# Patient Record
Sex: Male | Born: 1980 | Race: White | Hispanic: No | Marital: Married | Smoking: Former smoker
Health system: Southern US, Community
[De-identification: ages and names within clinical notes are randomized; demographics above are authoritative.]

## PROBLEM LIST (undated history)

## (undated) DIAGNOSIS — Z9889 Other specified postprocedural states: Secondary | ICD-10-CM

## (undated) DIAGNOSIS — L709 Acne, unspecified: Secondary | ICD-10-CM

## (undated) DIAGNOSIS — I10 Essential (primary) hypertension: Secondary | ICD-10-CM

## (undated) DIAGNOSIS — R112 Nausea with vomiting, unspecified: Secondary | ICD-10-CM

## (undated) DIAGNOSIS — R55 Syncope and collapse: Secondary | ICD-10-CM

## (undated) DIAGNOSIS — R002 Palpitations: Secondary | ICD-10-CM

## (undated) DIAGNOSIS — Q231 Congenital insufficiency of aortic valve: Secondary | ICD-10-CM

## (undated) DIAGNOSIS — R011 Cardiac murmur, unspecified: Secondary | ICD-10-CM

## (undated) DIAGNOSIS — N4400 Torsion of testis, unspecified: Secondary | ICD-10-CM

## (undated) DIAGNOSIS — Z87820 Personal history of traumatic brain injury: Secondary | ICD-10-CM

## (undated) HISTORY — DX: Palpitations: R00.2

## (undated) HISTORY — DX: Syncope and collapse: R55

## (undated) HISTORY — DX: Congenital insufficiency of aortic valve: Q23.1

## (undated) HISTORY — PX: ORCHIOPEXY: SHX479

---
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
## 1999-01-24 ENCOUNTER — Emergency Department (HOSPITAL_COMMUNITY): Admission: EM | Admit: 1999-01-24 | Discharge: 1999-01-25 | Payer: Self-pay | Admitting: Emergency Medicine

## 1999-05-26 ENCOUNTER — Emergency Department (HOSPITAL_COMMUNITY): Admission: EM | Admit: 1999-05-26 | Discharge: 1999-05-26 | Payer: Self-pay | Admitting: Emergency Medicine

## 1999-05-26 ENCOUNTER — Encounter: Payer: Self-pay | Admitting: Emergency Medicine

## 1999-06-02 ENCOUNTER — Encounter: Payer: Self-pay | Admitting: Orthopedic Surgery

## 1999-06-02 ENCOUNTER — Ambulatory Visit (HOSPITAL_COMMUNITY): Admission: RE | Admit: 1999-06-02 | Discharge: 1999-06-02 | Payer: Self-pay | Admitting: Orthopedic Surgery

## 1999-06-21 ENCOUNTER — Ambulatory Visit (HOSPITAL_BASED_OUTPATIENT_CLINIC_OR_DEPARTMENT_OTHER): Admission: RE | Admit: 1999-06-21 | Discharge: 1999-06-22 | Payer: Self-pay | Admitting: Orthopedic Surgery

## 1999-06-25 HISTORY — PX: ANTERIOR CRUCIATE LIGAMENT REPAIR: SHX115

## 2015-06-25 ENCOUNTER — Emergency Department (HOSPITAL_COMMUNITY): Payer: BLUE CROSS/BLUE SHIELD

## 2015-06-25 ENCOUNTER — Emergency Department (HOSPITAL_COMMUNITY)
Admission: EM | Admit: 2015-06-25 | Discharge: 2015-06-25 | Disposition: A | Discharge status: Home / Self Care | Payer: BLUE CROSS/BLUE SHIELD | Attending: Emergency Medicine | Admitting: Emergency Medicine

## 2015-06-25 ENCOUNTER — Encounter (HOSPITAL_COMMUNITY): Payer: Self-pay

## 2015-06-25 DIAGNOSIS — Y998 Other external cause status: Secondary | ICD-10-CM | POA: Diagnosis not present

## 2015-06-25 DIAGNOSIS — Z79899 Other long term (current) drug therapy: Secondary | ICD-10-CM | POA: Diagnosis not present

## 2015-06-25 DIAGNOSIS — W01198A Fall on same level from slipping, tripping and stumbling with subsequent striking against other object, initial encounter: Secondary | ICD-10-CM | POA: Insufficient documentation

## 2015-06-25 DIAGNOSIS — Z87891 Personal history of nicotine dependence: Secondary | ICD-10-CM | POA: Diagnosis not present

## 2015-06-25 DIAGNOSIS — Y9389 Activity, other specified: Secondary | ICD-10-CM | POA: Insufficient documentation

## 2015-06-25 DIAGNOSIS — S060X0A Concussion without loss of consciousness, initial encounter: Secondary | ICD-10-CM | POA: Diagnosis not present

## 2015-06-25 DIAGNOSIS — S0990XA Unspecified injury of head, initial encounter: Secondary | ICD-10-CM | POA: Diagnosis present

## 2015-06-25 DIAGNOSIS — F0781 Postconcussional syndrome: Secondary | ICD-10-CM

## 2015-06-25 DIAGNOSIS — Y9289 Other specified places as the place of occurrence of the external cause: Secondary | ICD-10-CM | POA: Diagnosis not present

## 2015-06-25 NOTE — ED Provider Notes (Signed)
CSN: [id number]     Arrival date & time 06/25/15  1605 History   First MD Initiated Contact with Patient 06/25/15 1810     Chief Complaint  Patient presents with  . Head Injury     (Consider location/radiation/quality/duration/timing/severity/associated sxs/prior Treatment) HPI  35 year old male that fell and hit his head approximately one week ago in the middle the night likely secondary to vasovagal. However ever since that time he's had intermittent headaches unlike headaches she's had before and also feelings of being "out of it". No history of concussions or other injuries. He has no other symptoms at this time. No chest pain, abdominal pain back pain or extremity pain. Exact complaining of any bruises or anything to his head. No change in vision. No exacerbating or relieving factors. No history of the same.    History reviewed. No pertinent past medical history. Past Surgical History  Procedure Laterality Date  . Anterior cruciate ligament repair     No family history on file. Social History  Substance Use Topics  . Smoking status: Former Games developermoker  . Smokeless tobacco: None  . Alcohol Use: Yes     Comment: occasional     Review of Systems  Constitutional: Negative for fever, chills and activity change.  HENT: Negative for congestion and rhinorrhea.   Eyes: Negative for visual disturbance.  Respiratory: Negative for cough and shortness of breath.   Cardiovascular: Negative for chest pain.  Gastrointestinal: Negative for vomiting, abdominal pain, diarrhea and constipation.  Endocrine: Negative for polyuria.  Genitourinary: Negative for dysuria, urgency, flank pain, decreased urine volume and penile pain.  Musculoskeletal: Negative for back pain and neck pain.  Skin: Negative for wound.  Neurological: Positive for dizziness and headaches. Negative for tremors and syncope.  All other systems reviewed and are negative.     Allergies  Review of patient's allergies  indicates no known allergies.  Home Medications   Prior to Admission medications   Medication Sig Start Date End Date Taking? Authorizing Provider  calcium carbonate (TUMS - DOSED IN MG ELEMENTAL CALCIUM) 500 MG chewable tablet Chew 2 tablets by mouth daily as needed for indigestion or heartburn.   Yes Historical Provider, MD  ibuprofen (ADVIL,MOTRIN) 200 MG tablet Take 400 mg by mouth every 6 (six) hours as needed for headache or moderate pain.   Yes Historical Provider, MD  Multiple Vitamin (MULTIVITAMIN WITH MINERALS) TABS tablet Take 1 tablet by mouth daily.   Yes Historical Provider, MD  tretinoin (RETIN-A) 0.1 % cream Apply 1 application topically daily as needed (acne).  05/10/15  Yes Historical Provider, MD   BP 134/88 mmHg  Pulse 63  Temp(Src) 97.5 F (36.4 C) (Oral)  Resp 14  SpO2 100% Physical Exam  Constitutional: He is oriented to person, place, and time. He appears well-developed and well-nourished.  HENT:  Head: Normocephalic and atraumatic.  Neck: Normal range of motion.  Cardiovascular: Normal rate.   Pulmonary/Chest: Effort normal. No respiratory distress.  Abdominal: He exhibits no distension.  Musculoskeletal: Normal range of motion. He exhibits no edema or tenderness.  Neurological: He is alert and oriented to person, place, and time.  No altered mental status, able to give full seemingly accurate history.  Face is symmetric, EOM's intact, pupils equal and reactive, vision intact, tongue and uvula midline without deviation Upper and Lower extremity motor 5/5, intact pain perception in distal extremities, 2+ reflexes in biceps, patella and achilles tendons. Finger to nose normal, heel to shin normal. Walks without assistance or  evident ataxia.    Nursing note and vitals reviewed.   ED Course  Procedures (including critical care time) Labs Review Labs Reviewed - No data to display  Imaging Review Ct Head Wo Contrast  06/25/2015  CLINICAL DATA:  Evaluate for  intraparenchymal bleed. Patient status post fall. Dizziness since the fall. No reported loss of consciousness. EXAM: CT HEAD WITHOUT CONTRAST TECHNIQUE: Contiguous axial images were obtained from the base of the skull through the vertex without intravenous contrast. COMPARISON:  None. FINDINGS: Ventricles and sulci are appropriate for patient's age. No evidence for acute cortically based infarct, intracranial hemorrhage, mass lesion mass-effect. Orbits are unremarkable. Paranasal sinuses are well aerated. Mastoid air cells are unremarkable. Calvarium is intact. IMPRESSION: No acute intracranial process. Electronically Signed   By: Annia Belt M.D.   On: 06/25/2015 17:01   I have personally reviewed and evaluated these images and lab results as part of my medical decision-making.   EKG Interpretation None      MDM   Final diagnoses:  Post concussive syndrome   35 year old male with a week of likely postconcussive syndrome. This happened after a fall. No evidence of head bleed on CT scan. Low suspicion for head bleed so don't think he needs a lumbar puncture at this time. No fevers or other symptoms suggest meningitis. No other symptoms suggesting intracranial abnormalities. Patient is stable for discharge with postconcussion discussion had at bedside. Follow with primary doctor as needed or neurology if prolonged symptoms.     Marily Memos, MD 06/25/15 2125

## 2015-06-25 NOTE — ED Notes (Signed)
Pt presents with c/o head injury. Pt reports that he fell on Christmas night and ever since then has been feeling dizzy and somewhat confused. Pt reports no vomiting but some nausea. Pt reports he "feels a little off". Reports that he consulted his family doctor today and was advised to have a CT scan done.

## 2015-06-25 NOTE — Discharge Instructions (Signed)
Post-Concussion Syndrome Post-concussion syndrome is the symptoms that can occur after a head injury. These symptoms can last from weeks to months. HOME CARE   Take medicines only as told by your doctor.  Do not take aspirin.  Sleep with your head raised to help with headaches.  Avoid activities that can cause another head injury.  Do not play contact sports like football, hockey, soccer, or basketball.  Do not do other risky activities like downhill skiing, martial arts, or horseback riding until your doctor says it is okay.  Keep all follow-up visits as told by your doctor. This is important.   GET HELP IF:   You have a harder time:  Paying attention.  Focusing.  Remembering.  Learning new information.  Dealing with stress.  You need more time to complete tasks.  You are easily bothered (irritable).  You have more symptoms.   Get help if you have any of these symptoms for more than two weeks after your injury:   Long-lasting (chronic) headaches.  Dizziness.  Trouble balancing.  Feeling sick to your stomach (nauseous).  Trouble with your vision.  Noise or light bothers you more.  Depression.  Mood swings.  Feeling worried (anxious).  Easily bothered.  Memory problems.  Trouble concentrating or paying attention.  Sleep problems.  Feeling tired all of the time.   GET HELP RIGHT AWAY IF:  You feel confused.  You feel very sleepy.  You are hard to wake up.  You feel sick to your stomach.  You keep throwing up (vomiting).  You feel like you are moving when you are not (vertigo).  Your eyes move back and forth very quickly.  You start shaking (convulsing) or pass out (faint).  You have very bad headaches that do not get better with medicine.  You cannot use your arms or legs like normal.  One of the black centers of your eyes (pupils) is bigger than the other.  You have clear or bloody fluid coming from your nose or ears.  Your  problems get worse, not better. MAKE SURE YOU:  Understand these instructions.  Will watch your condition.  Will get help right away if you are not doing well or get worse.   This information is not intended to replace advice given to you by your health care provider. Make sure you discuss any questions you have with your health care provider.   Document Released: 07/18/2004 Document Revised: 07/01/2014 Document Reviewed: 09/15/2013 Elsevier Interactive Patient Education 2016 [employer]  

## 2016-03-06 ENCOUNTER — Ambulatory Visit
Admission: RE | Admit: 2016-03-06 | Discharge: 2016-03-06 | Disposition: A | Discharge status: Home / Self Care | Payer: BLUE CROSS/BLUE SHIELD | Source: Ambulatory Visit | Attending: Registered Nurse | Admitting: Registered Nurse

## 2016-03-06 ENCOUNTER — Other Ambulatory Visit: Payer: Self-pay | Admitting: Registered Nurse

## 2016-03-06 DIAGNOSIS — M546 Pain in thoracic spine: Secondary | ICD-10-CM

## 2016-07-01 ENCOUNTER — Emergency Department (HOSPITAL_COMMUNITY): Payer: BLUE CROSS/BLUE SHIELD

## 2016-07-01 ENCOUNTER — Emergency Department (HOSPITAL_COMMUNITY)
Admission: EM | Admit: 2016-07-01 | Discharge: 2016-07-02 | Disposition: A | Discharge status: Home / Self Care | Payer: BLUE CROSS/BLUE SHIELD | Attending: Emergency Medicine | Admitting: Emergency Medicine

## 2016-07-01 ENCOUNTER — Encounter (HOSPITAL_COMMUNITY): Payer: Self-pay

## 2016-07-01 DIAGNOSIS — N50811 Right testicular pain: Secondary | ICD-10-CM

## 2016-07-01 DIAGNOSIS — Z87891 Personal history of nicotine dependence: Secondary | ICD-10-CM | POA: Insufficient documentation

## 2016-07-01 DIAGNOSIS — R52 Pain, unspecified: Secondary | ICD-10-CM

## 2016-07-01 HISTORY — DX: Torsion of testis, unspecified: N44.00

## 2016-07-01 LAB — URINALYSIS, ROUTINE W REFLEX MICROSCOPIC
Bilirubin Urine: NEGATIVE
GLUCOSE, UA: NEGATIVE mg/dL
Hgb urine dipstick: NEGATIVE
KETONES UR: NEGATIVE mg/dL
LEUKOCYTES UA: NEGATIVE
Nitrite: NEGATIVE
PROTEIN: NEGATIVE mg/dL
Specific Gravity, Urine: 1.015 (ref 1.005–1.030)
pH: 7 (ref 5.0–8.0)

## 2016-07-01 MED ORDER — CEFTRIAXONE SODIUM 250 MG IJ SOLR
250.0000 mg | Freq: Once | INTRAMUSCULAR | Status: AC
  Administered 2016-07-01: 250 mg via INTRAMUSCULAR
  Filled 2016-07-01: qty 250

## 2016-07-01 MED ORDER — OXYCODONE-ACETAMINOPHEN 5-325 MG PO TABS
ORAL_TABLET | ORAL | Status: AC
  Filled 2016-07-01: qty 1

## 2016-07-01 MED ORDER — LIDOCAINE HCL (PF) 1 % IJ SOLN
INTRAMUSCULAR | Status: AC
  Administered 2016-07-01: 1 mL
  Filled 2016-07-01: qty 5

## 2016-07-01 MED ORDER — OXYCODONE-ACETAMINOPHEN 5-325 MG PO TABS
1.0000 | ORAL_TABLET | ORAL | Status: DC | PRN
  Administered 2016-07-01: 1 via ORAL

## 2016-07-01 MED ORDER — AZITHROMYCIN 250 MG PO TABS
1000.0000 mg | ORAL_TABLET | Freq: Once | ORAL | Status: AC
  Administered 2016-07-01: 1000 mg via ORAL
  Filled 2016-07-01: qty 4

## 2016-07-01 NOTE — ED Notes (Signed)
Called US to retrieve patient ASAP

## 2016-07-01 NOTE — ED Provider Notes (Signed)
[facility]-EMERGENCY DEPT Provider Note   CSN: [id number] Arrival date & time: 07/01/16  1806  By signing my name below, I, Modena JanskyAlbert Thayil, attest that this documentation has been prepared under the direction and in the presence of non-physician practitioner, Felicie Mornavid Bricelyn Freestone, NP. Electronically Signed: Modena JanskyAlbert Thayil, Scribe. 07/01/2016. 11:12 PM.  History   Chief Complaint Chief Complaint  Patient presents with  . Testicle Pain   The history is provided by the patient. No language interpreter was used.  Testicle Pain  This is a new problem. The current episode started more than 2 days ago. The problem occurs constantly. The problem has been rapidly worsening. Associated symptoms include abdominal pain (Right-sided). Nothing aggravates the symptoms. Nothing relieves the symptoms. He has tried nothing for the symptoms.   HPI Comments: Douglas Marquez is a 36 y.o. male who presents to the Emergency Department complaining of constant moderate right testicular that started about a week ago. He states his pain worsened today. He describes the pain as a dull sensation with no modifying factors. He reports associated groin swelling and right-sided abdominal pain. He denies any nausea, vomiting (over the past week), or other complaints    PCP: Gaspar Garbeichard W Tisovec, MD  Past Medical History:  Diagnosis Date  . Testicular torsion     There are no active problems to display for this patient.   Past Surgical History:  Procedure Laterality Date  . ANTERIOR CRUCIATE LIGAMENT REPAIR         Home Medications    Prior to Admission medications   Medication Sig Start Date End Date Taking? Authorizing Provider  calcium carbonate (TUMS - DOSED IN MG ELEMENTAL CALCIUM) 500 MG chewable tablet Chew 2 tablets by mouth daily as needed for indigestion or heartburn.    Historical Provider, MD  ibuprofen (ADVIL,MOTRIN) 200 MG tablet Take 400 mg by mouth every 6 (six) hours as needed for headache or moderate pain.     Historical Provider, MD  Multiple Vitamin (MULTIVITAMIN WITH MINERALS) TABS tablet Take 1 tablet by mouth daily.    Historical Provider, MD  tretinoin (RETIN-A) 0.1 % cream Apply 1 application topically daily as needed (acne).  05/10/15   Historical Provider, MD    Family History No family history on file.  Social History Social History  Substance Use Topics  . Smoking status: Former Games developermoker  . Smokeless tobacco: Never Used  . Alcohol use Yes     Comment: occasional      Allergies   Patient has no known allergies.   Review of Systems Review of Systems  Gastrointestinal: Positive for abdominal pain (Right-sided). Negative for nausea and vomiting.  Genitourinary: Positive for scrotal swelling and testicular pain (Right-sided).  All other systems reviewed and are negative.    Physical Exam Updated Vital Signs BP 125/68   Pulse 68   Temp 98.7 F (37.1 C) (Oral)   Resp 16   Ht 6\' 1"  (1.854 m)   Wt 175 lb (79.4 kg)   SpO2 100%   BMI 23.09 kg/m   Physical Exam  Constitutional: He appears well-developed and well-nourished. No distress.  HENT:  Head: Normocephalic and atraumatic.  Eyes: Conjunctivae are normal.  Neck: Neck supple.  Cardiovascular: Normal rate.   Pulmonary/Chest: Effort normal.  Abdominal: Soft. Hernia confirmed negative in the right inguinal area and confirmed negative in the left inguinal area.  Genitourinary: Penis normal. Cremasteric reflex is present. Right testis shows tenderness. Right testis shows no mass and no swelling. Left testis shows no  mass, no swelling and no tenderness. Circumcised. No penile erythema. No discharge found.  Musculoskeletal: Normal range of motion.  Lymphadenopathy: No inguinal adenopathy noted on the right or left side.  Neurological: He is alert.  Skin: Skin is warm and dry.  Psychiatric: He has a normal mood and affect.  Nursing note and vitals reviewed.    ED Treatments / Results  DIAGNOSTIC STUDIES: Oxygen  Saturation is 100% on RA, normal by my interpretation.    COORDINATION OF CARE: 11:16 PM- Pt advised of plan for treatment and pt agrees.  Labs (all labs ordered are listed, but only abnormal results are displayed) Labs Reviewed  URINALYSIS, ROUTINE W REFLEX MICROSCOPIC    EKG  EKG Interpretation None       Radiology US Scrotum  Addendum Date: 07/01/2016   ADDENDUM REPORT: 07/01/2016 20:35 ADDENDUM: There is an error in the initial dictation of this report. Doppler ultrasound of the testes was performed, although not explicitly stated in the exam and findings portion of the initial report. Normal venous and arterial Doppler waveforms were seen in both testes. No sonographic evidence for torsion as stated in the initial impression. Electronically Signed   By: Rise Mu M.D.   On: 07/01/2016 20:35   Result Date: 07/01/2016 CLINICAL DATA:  Acute right testicular pain. Evaluate for testicular torsion. EXAM: ULTRASOUND OF SCROTUM TECHNIQUE: Complete ultrasound examination of the testicles, epididymis, and other scrotal structures was performed. COMPARISON:  None. FINDINGS: Right testicle Measurements: 4.7 x 2.5 x 2.7 cm. No mass or microlithiasis visualized. Left testicle Measurements: 5.3 x 2.6 x 2.8 cm. No mass or microlithiasis visualized. Right epididymis: Normal in size and appearance. Few small epididymal cyst noted. Left epididymis:  Normal in size and appearance. Hydrocele:  None visualized. Varicocele:  Left-sided varicocele. IMPRESSION: 1. No sonographic evidence for testicular torsion or other acute abnormality. 2. Left-sided varicocele. . Electronically Signed: By: Rise Mu M.D. On: 07/01/2016 19:35   [place] Art/ven Flow Abd Pelv Doppler  Addendum Date: 07/01/2016   ADDENDUM REPORT: 07/01/2016 20:35 ADDENDUM: There is an error in the initial dictation of this report. Doppler ultrasound of the testes was performed, although not explicitly stated in the exam and  findings portion of the initial report. Normal venous and arterial Doppler waveforms were seen in both testes. No sonographic evidence for torsion as stated in the initial impression. Electronically Signed   By: Rise Mu M.D.   On: 07/01/2016 20:35   Result Date: 07/01/2016 CLINICAL DATA:  Acute right testicular pain. Evaluate for testicular torsion. EXAM: ULTRASOUND OF SCROTUM TECHNIQUE: Complete ultrasound examination of the testicles, epididymis, and other scrotal structures was performed. COMPARISON:  None. FINDINGS: Right testicle Measurements: 4.7 x 2.5 x 2.7 cm. No mass or microlithiasis visualized. Left testicle Measurements: 5.3 x 2.6 x 2.8 cm. No mass or microlithiasis visualized. Right epididymis: Normal in size and appearance. Few small epididymal cyst noted. Left epididymis:  Normal in size and appearance. Hydrocele:  None visualized. Varicocele:  Left-sided varicocele. IMPRESSION: 1. No sonographic evidence for testicular torsion or other acute abnormality. 2. Left-sided varicocele. . Electronically Signed: By: Rise Mu M.D. On: 07/01/2016 19:35    Procedures Procedures (including critical care time)  Medications Ordered in ED Medications  oxyCODONE-acetaminophen (PERCOCET/ROXICET) 5-325 MG per tablet 1 tablet (1 tablet Oral Given 07/01/16 2152)     Initial Impression / Assessment and Plan / ED Course  I have reviewed the triage vital signs and the nursing notes.  Pertinent labs &  imaging results that were available during my care of the patient were reviewed by me and considered in my medical decision making (see chart for details).  Clinical Course   Patient with right testicular pain.  No torsion or blood flow abnormalities noted on testicular ultrasound. Normal UA. No penile discharge.  After discussion with attending, will treat for orchitis with rocephin and azithromycin. Urology follow-up. Home with anti-inflammatory. Return precautions  discussed.    Final Clinical Impressions(s) / ED Diagnoses   Final diagnoses:  Pain in right testicle    New Prescriptions New Prescriptions   NAPROXEN (NAPROSYN) 500 MG TABLET    Take 1 tablet (500 mg total) by mouth 2 (two) times daily.   I personally performed the services described in this documentation, which was scribed in my presence. The recorded information has been reviewed and is accurate.     Felicie Morn, NP 07/02/16 [id number]    Marily Memos, MD 07/02/16 [phone]

## 2016-07-01 NOTE — ED Triage Notes (Signed)
Per Pt, Pt is coming from home with complains of right groin pain. Pt had a testicular torsion in 97. About a week ago, pt had an episode with extreme pain and three syncopal episodes. Pt was assessed by EMS and pt did not get transport. Saw the urologist and they stated the there was decreased blood flow and he needed surgery. Pt requested to wait at the time. Pt reports today having and increase of pain in the right testicle with some contraction. Pt reports pain has worsened. Called urologist and was sent here for evaluation.

## 2016-07-02 MED ORDER — NAPROXEN 500 MG PO TABS: 500.0000 mg | ORAL_TABLET | Freq: Two times a day (BID) | ORAL | 0 refills | Status: DC

## 2016-07-02 NOTE — ED Notes (Signed)
Pt verbalized understanding of d/c instructions and has no further questions. Pt stable and NAD. Pt to follow up with urology.

## 2016-07-05 ENCOUNTER — Other Ambulatory Visit: Payer: Self-pay | Admitting: Urology

## 2016-07-05 NOTE — Progress Notes (Signed)
Please add SURGICAL ORDERS IN EPIC  Thanks

## 2016-07-11 ENCOUNTER — Encounter (HOSPITAL_BASED_OUTPATIENT_CLINIC_OR_DEPARTMENT_OTHER): Payer: Self-pay | Admitting: *Deleted

## 2016-07-11 NOTE — Progress Notes (Signed)
SPOKE W/ PT TODAY , [facility] AND SPOKE WITH CONI, OR SCHEDULER , CASE RESCHEDULED FOR 07-22-2016 AT 1415.  NPO AFTER MN W/ EXCEPTION CLEAR LIQUIDS UNTIL 0800 (NO CREAM/ MILK PRODUCTS).  ARRIVE AT 1245.  NEEDS HG.  MAY TAKE TYLENOL IF NEEDED AM DOS W/ SIPS OF WATER.

## 2016-07-22 ENCOUNTER — Ambulatory Visit (HOSPITAL_BASED_OUTPATIENT_CLINIC_OR_DEPARTMENT_OTHER): Admission: RE | Admit: 2016-07-22 | Payer: BLUE CROSS/BLUE SHIELD | Source: Ambulatory Visit | Admitting: Urology

## 2016-07-22 HISTORY — DX: Other specified postprocedural states: Z98.890

## 2016-07-22 HISTORY — DX: Nausea with vomiting, unspecified: R11.2

## 2016-07-22 HISTORY — DX: Acne, unspecified: L70.9

## 2016-07-22 HISTORY — DX: Personal history of traumatic brain injury: Z87.820

## 2016-07-22 HISTORY — DX: Cardiac murmur, unspecified: R01.1

## 2016-07-22 SURGERY — ORCHIOPEXY ADULT
Anesthesia: General | Laterality: Bilateral

## 2016-11-09 ENCOUNTER — Encounter (HOSPITAL_COMMUNITY): Payer: Self-pay

## 2016-11-09 ENCOUNTER — Emergency Department (HOSPITAL_COMMUNITY)
Admission: EM | Admit: 2016-11-09 | Discharge: 2016-11-09 | Disposition: A | Discharge status: Home / Self Care | Payer: BLUE CROSS/BLUE SHIELD | Attending: Emergency Medicine | Admitting: Emergency Medicine

## 2016-11-09 DIAGNOSIS — R55 Syncope and collapse: Secondary | ICD-10-CM | POA: Insufficient documentation

## 2016-11-09 DIAGNOSIS — Z87891 Personal history of nicotine dependence: Secondary | ICD-10-CM | POA: Diagnosis not present

## 2016-11-09 LAB — BASIC METABOLIC PANEL
ANION GAP: 11 (ref 5–15)
BUN: 12 mg/dL (ref 6–20)
CALCIUM: 9.6 mg/dL (ref 8.9–10.3)
CO2: 21 mmol/L — AB (ref 22–32)
Chloride: 103 mmol/L (ref 101–111)
Creatinine, Ser: 1.21 mg/dL (ref 0.61–1.24)
GFR calc Af Amer: >60 mL/min (ref >60)
GFR calc non Af Amer: >60 mL/min (ref >60)
Glucose, Bld: 107 mg/dL — ABNORMAL HIGH (ref 65–99)
POTASSIUM: 4 mmol/L (ref 3.5–5.1)
Sodium: 135 mmol/L (ref 135–145)

## 2016-11-09 LAB — CBC
HEMATOCRIT: 41.2 % (ref 39.0–52.0)
HEMOGLOBIN: 13.8 g/dL (ref 13.0–17.0)
MCH: 28.8 pg (ref 26.0–34.0)
MCHC: 33.5 g/dL (ref 30.0–36.0)
MCV: 86 fL (ref 78.0–100.0)
Platelets: 252 10*3/uL (ref 150–400)
RBC: 4.79 MIL/uL (ref 4.22–5.81)
RDW: 12.9 % (ref 11.5–15.5)
WBC: 6 10*3/uL (ref 4.0–10.5)

## 2016-11-09 LAB — URINALYSIS, ROUTINE W REFLEX MICROSCOPIC
BACTERIA UA: NONE SEEN
Bilirubin Urine: NEGATIVE
Glucose, UA: NEGATIVE mg/dL
Hgb urine dipstick: NEGATIVE
KETONES UR: NEGATIVE mg/dL
Leukocytes, UA: NEGATIVE
NITRITE: NEGATIVE
PROTEIN: 30 mg/dL — AB
Specific Gravity, Urine: 1.011 (ref 1.005–1.030)
pH: 7 (ref 5.0–8.0)

## 2016-11-09 MED ORDER — SODIUM CHLORIDE 0.9 % IV BOLUS (SEPSIS)
1000.0000 mL | Freq: Once | INTRAVENOUS | Status: AC
  Administered 2016-11-09: 1000 mL via INTRAVENOUS

## 2016-11-09 NOTE — ED Provider Notes (Signed)
[facility]-EMERGENCY DEPT Provider Note   CSN: [id number] Arrival date & time: 11/09/16  1052     History   Chief Complaint Chief Complaint  Patient presents with  . Loss of Consciousness    pt while working out had near syncopal episode than hen EMS arrived he had another syncopal episode     HPI Douglas Marquez is a 36 y.o. male.  Patient with history of syncopal episodes presents with syncopal episode this morning while working out with a Psychologist, educational. They were lifting weights. Patient states that he drank a lot of coffee this morning and has not been hydrating well. Approximately 20 minutes into the workout he started feeling lightheaded and very tired. He was allowed to lay flat and he reports having 3 syncopal episodes with preceding prodrome. He felt nauseous and was sweaty. No chest pain or shortness of breath. Episodes were brief with no seizure activity or postictal phase. Patient was told that his eyes were deviated to the left during one episode. EMS was called for transport. Patient has had previous episodes, once after drinking alcohol. They have not all been related to exertion. No recent fever or other symptoms of illness. Patient denies over-the-counter cold medications. No family history of arrhythmia or sudden cardiac death at a young age. No previous echocardiograms or workup for this. Patient does report having chronic intermittent pains down his leg and history of groin pain. This is not worse than usual today. The onset of this condition was acute. The course is resolved. Aggravating factors: none. Alleviating factors: none.        Past Medical History:  Diagnosis Date  . Acne   . Heart murmur   . History of concussion    12/ 2016  no residual   . PONV (postoperative nausea and vomiting)   . Testicular torsion    intermittent bilaterally    There are no active problems to display for this patient.   Past Surgical History:  Procedure Laterality Date  . ANTERIOR  CRUCIATE LIGAMENT REPAIR Left 06/1999  . ORCHIOPEXY Bilateral 1990's       Home Medications    Prior to Admission medications   Medication Sig Start Date End Date Taking? Authorizing Provider  calcium carbonate (TUMS - DOSED IN MG ELEMENTAL CALCIUM) 500 MG chewable tablet Chew 2 tablets by mouth daily as needed for indigestion or heartburn.    [provider]  ibuprofen (ADVIL,MOTRIN) 200 MG tablet Take 400 mg by mouth every 6 (six) hours as needed for headache or moderate pain.    [provider]  Multiple Vitamin (MULTIVITAMIN WITH MINERALS) TABS tablet Take 1 tablet by mouth daily.    [provider]  naproxen (NAPROSYN) 500 MG tablet Take 1 tablet (500 mg total) by mouth 2 (two) times daily. Patient taking differently: Take 500 mg by mouth 2 (two) times daily as needed.  07/02/16   Felicie Morn, NP  tretinoin (RETIN-A) 0.1 % cream Apply 1 application topically daily as needed (acne).  05/10/15   [provider]    Family History No family history on file.  Social History Social History  Substance Use Topics  . Smoking status: Former Smoker    Years: 2.00    Types: Cigarettes    Quit date: 07/11/2008  . Smokeless tobacco: Never Used  . Alcohol use Yes     Comment: RARE     Allergies   Patient has no known allergies.   Review of Systems Review of Systems  Constitutional: Positive for fatigue. Negative for fever.  HENT: Negative for rhinorrhea and sore throat.   Eyes: Negative for redness.  Respiratory: Negative for cough.   Cardiovascular: Negative for chest pain.  Gastrointestinal: Negative for abdominal pain, diarrhea, nausea and vomiting.  Genitourinary: Negative for dysuria.  Musculoskeletal: Negative for myalgias.  Skin: Negative for rash.  Neurological: Positive for syncope. Negative for headaches.     Physical Exam Updated Vital Signs BP 99/62 (BP Location: Right Arm)   Pulse 78   Temp 97.8 F (36.6 C) (Oral)   Resp  19   Ht 6\' 1"  (1.854 m)   Wt 183 lb (83 kg)   SpO2 100%   BMI 24.14 kg/m   Physical Exam  Constitutional: He appears well-developed and well-nourished.  HENT:  Head: Normocephalic and atraumatic.  Mouth/Throat: Oropharynx is clear and moist and mucous membranes are normal. Mucous membranes are not dry.  Eyes: Conjunctivae are normal. Right eye exhibits no discharge. Left eye exhibits no discharge.  Neck: Trachea normal and normal range of motion. Neck supple. Normal carotid pulses and no JVD present. No muscular tenderness present. Carotid bruit is not present. No tracheal deviation present.  Cardiovascular: Normal rate, regular rhythm, S1 normal, S2 normal, normal heart sounds and intact distal pulses.  Exam reveals no distant heart sounds and no decreased pulses.   No murmur heard. Pulmonary/Chest: Effort normal and breath sounds normal. No respiratory distress. He has no wheezes. He exhibits no tenderness.  Abdominal: Soft. Normal aorta and bowel sounds are normal. There is no tenderness. There is no rebound and no guarding.  Musculoskeletal: He exhibits no edema.  Neurological: He is alert.  Skin: Skin is warm and dry. He is not diaphoretic. No cyanosis. No pallor.  Psychiatric: He has a normal mood and affect.  Nursing note and vitals reviewed.    ED Treatments / Results  Labs (all labs ordered are listed, but only abnormal results are displayed) Labs Reviewed  BASIC METABOLIC PANEL - Abnormal; Notable for the following:       Result Value   CO2 21 (*)    Glucose, Bld 107 (*)    All other components within normal limits  CBC  URINALYSIS, ROUTINE W REFLEX MICROSCOPIC    EKG  EKG Interpretation  Date/Time:  Saturday Nov 09 2016 11:02:48 EDT Ventricular Rate:  72 PR Interval:    QRS Duration: 117 QT Interval:  407 QTC Calculation: 446 R Axis:   14 Text Interpretation:  Sinus rhythm Nonspecific intraventricular conduction delay ST elev, probable normal early repol  pattern No previous ECGs available Confirmed by Mancel BaleWentz, Elliott [phone]([id number]) on 11/09/2016 11:08:30 AM       Radiology No results found.  Procedures Procedures (including critical care time)  Medications Ordered in ED Medications  sodium chloride 0.9 % bolus 1,000 mL (0 mLs Intravenous Stopped 11/09/16 1324)      Initial Impression / Assessment and Plan / ED Course  I have reviewed the triage vital signs and the nursing notes.  Pertinent labs & imaging results that were available during my care of the patient were reviewed by me and considered in my medical decision making (see chart for details).     Patient seen and examined. Work-up initiated. Medications ordered.   Vital signs reviewed and are as follows: BP 99/62 (BP Location: Right Arm)   Pulse 78   Temp 97.8 F (36.6 C) (Oral)   Resp 19   Ht 6\' 1"  (1.854 m)   Wt  183 lb (83 kg)   SpO2 100%   BMI 24.14 kg/m   EKG reviewed.   1:48 PM patient continuing to do well. I discussed findings with him and family at bedside.  Will give cardiology referral patient encouraged not to do any strenuous exercise until he follows up and is clear.  Encouraged return to the emergency department with additional syncopal episodes, especially if they have any features which are different than previous, chest pain or shortness of breath.  Patient verbalizes understanding and agrees with plan.    Final Clinical Impressions(s) / ED Diagnoses   Final diagnoses:  Syncope, unspecified syncope type   Patient had several grouped episodes of syncope today while working out. He had a positive prodrome and has had this with episodes in the past. He admits to drinking a lot of coffee this morning and not being well hydrated. He felt fatigued prior to the workout. He had no chest pain. No seizure-like activity. No neurological deficits during ED visit. Cardiovascular exam is normal. EKG does not show any prolonged QTC, signs of reentrant  tachycardia, Brugada syndrome, or other concerning abnormality here. Given that patient had exertional symptoms today, feel that cardiology evaluation prior to resuming strenuous exercise is recommended. Patient given referrals as above.  New Prescriptions Discharge Medication List as of 11/09/2016  1:43 PM       Renne Crigler, PA-C 11/09/16 1350    Mancel Bale, MD 11/09/16 [phone]

## 2016-11-09 NOTE — ED Triage Notes (Signed)
Pt arrives awake and alert wife at bedside states that this has occurred several times and that pt. Has not been worked up by neurology for these episodes

## 2016-11-09 NOTE — Discharge Instructions (Signed)
Please read and follow all provided instructions.  Your diagnoses today include:  1. Syncope, unspecified syncope type    Tests performed today include:  An EKG of your heart  Blood counts and electrolytes  Urine test  Vital signs. See below for your results today.   Medications prescribed:   None  Take any prescribed medications only as directed.  Follow-up instructions: Please follow-up with the cardiologist for further evaluation as we discussed.   Return instructions:  SEEK IMMEDIATE MEDICAL ATTENTION IF:  You have severe chest pain, especially if the pain is crushing or pressure-like and spreads to the arms, back, neck, or jaw, or if you have sweating, nausea (feeling sick to your stomach), or shortness of breath. THIS IS AN EMERGENCY. Don't wait to see if the pain will go away. Get medical help at once. Call 911 or 0 (operator). DO NOT drive yourself to the hospital.   Your chest pain gets worse and does not go away with rest.   You have an attack of chest pain lasting longer than usual, despite rest and treatment with the medications your caregiver has prescribed.   You wake from sleep with chest pain or shortness of breath.  You feel dizzy or faint.  You have chest pain not typical of your usual pain for which you originally saw your caregiver.   You have any other emergent concerns regarding your health.  Your vital signs today were: BP 104/65 (BP Location: Right Arm)    Pulse 61    Temp 97.8 F (36.6 C) (Oral)    Resp 17    Ht 6\' 1"  (1.854 m)    Wt 183 lb (83 kg)    SpO2 100%    BMI 24.14 kg/m  If your blood pressure (BP) was elevated above 135/85 this visit, please have this repeated by your doctor within one month. --------------

## 2016-11-14 ENCOUNTER — Encounter: Payer: Self-pay | Admitting: Cardiovascular Disease

## 2016-11-14 ENCOUNTER — Ambulatory Visit (INDEPENDENT_AMBULATORY_CARE_PROVIDER_SITE_OTHER): Payer: BLUE CROSS/BLUE SHIELD | Admitting: Cardiovascular Disease

## 2016-11-14 VITALS — BP 124/86 | HR 66 | Ht 73.0 in | Wt 187.8 lb

## 2016-11-14 DIAGNOSIS — R079 Chest pain, unspecified: Secondary | ICD-10-CM | POA: Diagnosis not present

## 2016-11-14 DIAGNOSIS — R011 Cardiac murmur, unspecified: Secondary | ICD-10-CM

## 2016-11-14 DIAGNOSIS — Z13220 Encounter for screening for lipoid disorders: Secondary | ICD-10-CM

## 2016-11-14 DIAGNOSIS — R002 Palpitations: Secondary | ICD-10-CM

## 2016-11-14 DIAGNOSIS — R55 Syncope and collapse: Secondary | ICD-10-CM | POA: Diagnosis not present

## 2016-11-14 HISTORY — DX: Syncope and collapse: R55

## 2016-11-14 HISTORY — DX: Palpitations: R00.2

## 2016-11-14 LAB — T4, FREE: Free T4: 1.71 ng/dL (ref 0.82–1.77)

## 2016-11-14 LAB — LIPID PANEL
CHOL/HDL RATIO: 3.7 ratio (ref 0.0–5.0)
Cholesterol, Total: 184 mg/dL (ref 100–199)
HDL: 50 mg/dL (ref >39)
LDL CALC: 117 mg/dL — AB (ref 0–99)
TRIGLYCERIDES: 85 mg/dL (ref 0–149)
VLDL Cholesterol Cal: 17 mg/dL (ref 5–40)

## 2016-11-14 LAB — TSH: TSH: 1.43 u[IU]/mL (ref 0.450–4.500)

## 2016-11-14 NOTE — Progress Notes (Signed)
Cardiology Office Note   Date:  11/14/2016   ID:  Douglas CanalRobert Marquez, DOB 11-04-1980, MRN [id number]  PCP:  Gaspar Garbeisovec, Richard W, MD  Cardiologist:   Chilton Siiffany Melvin, MD   Chief Complaint  Patient presents with  . New Patient (Initial Visit)    chest pain     History of Present Illness: Douglas Marquez is a 36 y.o. male who presents for an evaluation of syncope.  Douglas Marquez was seen in the ED 11/09/16 with an episode of syncope that occurred after working out.  That morning he awakened early and drinks 6 cups of coffee. On average he drinks 4 or 5 cups of coffee daily. Several hours later he went to work with the trainer. 27 minutes into the workout he started getting nauseous and very hot. He recalls feeling poorly and lay down with his legs elevated. The next thing he remembers people were around him trying to wake him up. He initially thought that he was well but had another syncopal episode prior to the arrival of EMS. Once the EMS workers arrived his blood pressure was reportedly in the 90s systolic. He had another syncopal episode while talking with them. Each time he was out for between 10 and 30 seconds. There is no preceding chest pain, palpitations, or shortness of breath. He was seen in the emergency departmen  where EKG revealed sinus rhythm with repolarization abnormalities but was otherwise unremarkable. He was referred to cardiology for outpatient evaluation.  Since that episode Douglas Marquez reports feeling well for the first 2 days. However in the last 2 days he has been feeling anxious and tired. He notes episodes of sharp, substernal chest pain that feels almost like indigestion. He thinks it is been more diaphoretic then usual. These episodes of chest pain last for approximately 10 seconds and never with exertion. He has noted that when laying down or when sitting. He hasn't done much strenuous activity but has cut his grass and other mild activities and hasn't experienced any chest pain or  shortness of breath. He is also somewhat more short of breath than usual when climbing stairs. He notes that he is very anxious after this episode, especially since his wife is currently out of town. He's had several episodes of syncope in the past. It happened some in college. Prior triggers have included having intense abdominal pain and standing quickly. He also had an episode of syncope in the setting of testicular torsion.  He notes occasional palpitations. This typically happens at rest and lasts for less than 1 minute. It is associated with mild lightheadedness. This occurred a few times over the last several months.  Douglas Marquez has a family history of premature CAD. Both maternal grandparents had heart attacks in their 5040s.   Past Medical History:  Diagnosis Date  . Acne   . Heart murmur   . History of concussion    12/ 2016  no residual   . Palpitations 11/14/2016  . PONV (postoperative nausea and vomiting)   . Syncope 11/14/2016  . Testicular torsion    intermittent bilaterally    Past Surgical History:  Procedure Laterality Date  . ANTERIOR CRUCIATE LIGAMENT REPAIR Left 06/1999  . ORCHIOPEXY Bilateral 1990's     Current Outpatient Prescriptions  Medication Sig Dispense Refill  . calcium carbonate (TUMS - DOSED IN MG ELEMENTAL CALCIUM) 500 MG chewable tablet Chew 2 tablets by mouth daily as needed for indigestion or heartburn.    Marland Kitchen. ibuprofen (ADVIL,MOTRIN)  200 MG tablet Take 400 mg by mouth every 6 (six) hours as needed for headache or moderate pain.    . Multiple Vitamin (MULTIVITAMIN WITH MINERALS) TABS tablet Take 1 tablet by mouth daily.    Marland Kitchen tretinoin (RETIN-A) 0.1 % cream Apply 1 application topically daily as needed (acne).   0   No current facility-administered medications for this visit.     Allergies:   Patient has no known allergies.    Social History:  The patient  reports that he quit smoking about 8 years ago. His smoking use included Cigarettes. He quit  after 2.00 years of use. He has never used smokeless tobacco. He reports that he drinks alcohol. He reports that he does not use drugs.   Family History:  The patient's family history includes Cancer in his maternal grandmother and paternal grandfather; Heart attack in his maternal grandfather and maternal grandmother; Heart disease in his paternal grandfather; Parkinson's disease in his maternal grandfather.    ROS:  Please see the history of present illness.   Otherwise, review of systems are positive for none.   All other systems are reviewed and negative.    PHYSICAL EXAM: VS:  BP 124/86 (BP Location: Left Arm)   Pulse 66   Ht 6\' 1"  (1.854 m)   Wt 85.2 kg (187 lb 12.8 oz)   SpO2 100%   BMI 24.78 kg/m  , BMI Body mass index is 24.78 kg/m. GENERAL:  Well appearing HEENT:  Pupils equal round and reactive, fundi not visualized, oral mucosa unremarkable NECK:  No jugular venous distention, waveform within normal limits, carotid upstroke brisk and symmetric, no bruits, no thyromegaly LYMPHATICS:  No cervical adenopathy LUNGS:  Clear to auscultation bilaterally HEART:  RRR.  PMI not displaced or sustained,S1 and S2 within normal limits, no S3, no S4, no clicks, no rubs, I/VI systolic murmur at LLSB ABD:  Flat, positive bowel sounds normal in frequency in pitch, no bruits, no rebound, no guarding, no midline pulsatile mass, no hepatomegaly, no splenomegaly EXT:  2 plus pulses throughout, no edema, no cyanosis no clubbing SKIN:  No rashes no nodules NEURO:  Cranial nerves II through XII grossly intact, motor grossly intact throughout PSYCH:  Cognitively intact, oriented to person place and time    EKG:  EKG is not ordered today. The ekg ordered 11/09/13 demonstrates sinus rhythm. Rate 72 bpm.    Recent Labs: 11/09/2016: BUN 12; Creatinine, Ser 1.21; Hemoglobin 13.8; Platelets 252; Potassium 4.0; Sodium 135    Lipid Panel No results found for: CHOL, TRIG, HDL, CHOLHDL, VLDL, LDLCALC,  LDLDIRECT    Wt Readings from Last 3 Encounters:  11/14/16 85.2 kg (187 lb 12.8 oz)  11/09/16 83 kg (183 lb)  07/01/16 79.4 kg (175 lb)      ASSESSMENT AND PLAN:  # Syncope:  This episode was likely triggered by intravascular volume depletion and heavy caffeine intake.  EKG is unremarkable and without evidence of arrhythmias, Brugada and long QT.  He has a history of recurrent syncope that has been related to an orthostatic or vasovagal episode.  Given that this was associated with exercise and he has a family history of MI in the 5s, we will get an ETT to evaluate for both ischemia and arrhythmias.  We will also get an echo to assess for structural abnormality.  He has a faint systolic murmur.  R/O HCM.  # Palpitations:  Episodes are infrequent.  Check TSH/free T4.  Continue to monitor and defer  ambulatory monitor for now.   # CV Disease Prevention: Check fasting lipids.  Current medicines are reviewed at length with the patient today.  The patient does not have concerns regarding medicines.  The following changes have been made:  no change  Labs/ tests ordered today include:   Orders Placed This Encounter  Procedures  . Lipid panel  . TSH  . T4, free  . EXERCISE TOLERANCE TEST  . ECHOCARDIOGRAM COMPLETE     Disposition:   FU with Jep Dyas C. Duke Salvia, MD, [facility] in 1 month.    This note was written with the assistance of speech recognition software.  Please excuse any transcriptional errors.  Signed, Lindy Pennisi C. Duke Salvia, MD, [facility]  11/14/2016 1:17 PM    [facility]

## 2016-11-14 NOTE — Patient Instructions (Signed)
Your physician recommends that you return for lab work TODAY  Your physician has requested that you have an echocardiogram @ [address]. Echocardiography is a painless test that uses sound waves to create images of your heart. It provides your doctor with information about the size and shape of your heart and how well your heart's chambers and valves are working. This procedure takes approximately one hour. There are no restrictions for this procedure.  Your physician has requested that you have an exercise tolerance test. For further information please visit [email or web address]. Please also follow instruction sheet, as given.  Your physician recommends that you schedule a follow-up appointment in: ONE MONTH with Dr. Duke Salviaandolph (after your testing)

## 2016-11-22 ENCOUNTER — Telehealth: Payer: Self-pay | Admitting: Cardiovascular Disease

## 2016-11-22 NOTE — Telephone Encounter (Signed)
Spoke with pt wife, labs and dr Leonides Sakerandolph's recommendations given.

## 2016-11-22 NOTE — Telephone Encounter (Signed)
New message   Pt wife is returning your call for lab results

## 2016-11-28 ENCOUNTER — Telehealth (HOSPITAL_COMMUNITY): Payer: Self-pay

## 2016-11-28 ENCOUNTER — Ambulatory Visit (HOSPITAL_COMMUNITY): Payer: BLUE CROSS/BLUE SHIELD | Attending: Cardiology

## 2016-11-28 ENCOUNTER — Other Ambulatory Visit: Payer: Self-pay

## 2016-11-28 DIAGNOSIS — I082 Rheumatic disorders of both aortic and tricuspid valves: Secondary | ICD-10-CM | POA: Diagnosis not present

## 2016-11-28 DIAGNOSIS — R002 Palpitations: Secondary | ICD-10-CM | POA: Diagnosis not present

## 2016-11-28 DIAGNOSIS — R011 Cardiac murmur, unspecified: Secondary | ICD-10-CM | POA: Diagnosis not present

## 2016-11-28 DIAGNOSIS — Q231 Congenital insufficiency of aortic valve: Secondary | ICD-10-CM | POA: Diagnosis not present

## 2016-11-28 DIAGNOSIS — R55 Syncope and collapse: Secondary | ICD-10-CM | POA: Diagnosis not present

## 2016-11-28 NOTE — Telephone Encounter (Signed)
Encounter complete. 

## 2016-12-02 ENCOUNTER — Telehealth: Payer: Self-pay | Admitting: Cardiovascular Disease

## 2016-12-02 NOTE — Telephone Encounter (Signed)
Pt would like his Echo results from 11-28-16 please.

## 2016-12-02 NOTE — Telephone Encounter (Signed)
Patient's call returned and results given. He verbalized his understanding. Appointment for stress test tomorrow was confirmed.   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 It is functioning well. This is not the cause of his syncope. Will discuss more at follow up.

## 2016-12-03 ENCOUNTER — Ambulatory Visit (HOSPITAL_COMMUNITY)
Admission: RE | Admit: 2016-12-03 | Discharge: 2016-12-03 | Disposition: A | Discharge status: Home / Self Care | Payer: BLUE CROSS/BLUE SHIELD | Source: Ambulatory Visit | Attending: Cardiovascular Disease | Admitting: Cardiovascular Disease

## 2016-12-03 DIAGNOSIS — R079 Chest pain, unspecified: Secondary | ICD-10-CM | POA: Diagnosis not present

## 2016-12-03 LAB — EXERCISE TOLERANCE TEST
CHL CUP MPHR: 182 {beats}/min
CHL CUP RESTING HR STRESS: 61 {beats}/min
CSEPEW: 13.8 METS
CSEPHR: 99 %
CSEPPHR: 181 {beats}/min
Exercise duration (min): 12 min
Exercise duration (sec): 13 s
RPE: 18

## 2016-12-24 ENCOUNTER — Telehealth: Payer: Self-pay | Admitting: Cardiovascular Disease

## 2016-12-24 NOTE — Telephone Encounter (Signed)
Closed Encounter  °

## 2017-01-02 ENCOUNTER — Ambulatory Visit: Payer: BLUE CROSS/BLUE SHIELD | Admitting: Cardiovascular Disease

## 2017-01-23 ENCOUNTER — Ambulatory Visit: Payer: BLUE CROSS/BLUE SHIELD | Admitting: Cardiovascular Disease

## 2017-02-28 ENCOUNTER — Encounter: Payer: Self-pay | Admitting: Cardiovascular Disease

## 2017-02-28 ENCOUNTER — Encounter (INDEPENDENT_AMBULATORY_CARE_PROVIDER_SITE_OTHER): Payer: Self-pay

## 2017-02-28 ENCOUNTER — Ambulatory Visit (INDEPENDENT_AMBULATORY_CARE_PROVIDER_SITE_OTHER): Payer: BLUE CROSS/BLUE SHIELD | Admitting: Cardiovascular Disease

## 2017-02-28 VITALS — BP 120/74 | HR 66 | Ht 73.0 in | Wt 198.8 lb

## 2017-02-28 DIAGNOSIS — Q2381 Bicuspid aortic valve: Secondary | ICD-10-CM

## 2017-02-28 DIAGNOSIS — R011 Cardiac murmur, unspecified: Secondary | ICD-10-CM

## 2017-02-28 DIAGNOSIS — Q231 Congenital insufficiency of aortic valve: Secondary | ICD-10-CM | POA: Diagnosis not present

## 2017-02-28 HISTORY — DX: Congenital insufficiency of aortic valve: Q23.1

## 2017-02-28 HISTORY — DX: Bicuspid aortic valve: Q23.81

## 2017-02-28 NOTE — Progress Notes (Signed)
Cardiology Office Note   Date:  02/28/2017   ID:  Douglas Marquez, DOB 06/06/1981, MRN [id number]  PCP:  Gaspar Garbe, MD  Cardiologist:   Chilton Si, MD   Chief Complaint  Patient presents with  . Follow-up    Pt states no Sx.      History of Present Illness: Douglas Marquez is a 36 y.o. male with bicuspid aortic valve and recurrent syncope who presents for follow-up. He was initially seen 10/2016 foran evaluation of syncope.  Mr. Koller was seen in the ED 11/09/16 with an episode of syncope that occurred after working out.  That morning he awakened early and drinks 6 cups of coffee. On average he drinks 4 or 5 cups of coffee daily.  Once the EMS workers arrived his blood pressure was reportedly in the 90s systolic. He had another syncopal episode while talking with them. At his appointment he reported atypical chest pain and was referred for an ETT that was negative for ischemia. He achieved 13.8 METS on a Bruce protocol. He was noted to have a systolic murmur and was referred for an echocardiogram 11/28/16 that revealed LVEF 50-55% and a bicuspid aortic valve with mild aortic regurgitation.  Mr. Bienvenue has a family history of premature CAD. Both maternal grandparents had heart attacks in their 35s.  Since his last appointment Mr. Mogel has been doing well.  He denies any chest pain or shortness of breath.  He hasn't been exercising much because of pain and numbness in his R leg.  He has follow up with orthopedics and neurology.  He is limiting his caffeine and denies palpitations, lightheadedness, dizziness or syncope.  He denies lower extremity edema, orthopnea or PND.   Past Medical History:  Diagnosis Date  . Acne   . Bicuspid aortic valve 02/28/2017  . Heart murmur   . History of concussion    12/ 2016  no residual   . Palpitations 11/14/2016  . PONV (postoperative nausea and vomiting)   . Syncope 11/14/2016  . Testicular torsion    intermittent bilaterally    Past Surgical  History:  Procedure Laterality Date  . ANTERIOR CRUCIATE LIGAMENT REPAIR Left 06/1999  . ORCHIOPEXY Bilateral 1990's     Current Outpatient Prescriptions  Medication Sig Dispense Refill  . calcium carbonate (TUMS - DOSED IN MG ELEMENTAL CALCIUM) 500 MG chewable tablet Chew 2 tablets by mouth daily as needed for indigestion or heartburn.    Marland Kitchen ibuprofen (ADVIL,MOTRIN) 200 MG tablet Take 400 mg by mouth every 6 (six) hours as needed for headache or moderate pain.    . Multiple Vitamin (MULTIVITAMIN WITH MINERALS) TABS tablet Take 1 tablet by mouth daily.    Marland Kitchen tretinoin (RETIN-A) 0.1 % cream Apply 1 application topically daily as needed (acne).   0   No current facility-administered medications for this visit.     Allergies:   Patient has no known allergies.    Social History:  The patient  reports that he quit smoking about 8 years ago. His smoking use included Cigarettes. He quit after 2.00 years of use. He has never used smokeless tobacco. He reports that he drinks alcohol. He reports that he does not use drugs.   Family History:  The patient's family history includes Cancer in his maternal grandmother and paternal grandfather; Heart attack in his maternal grandfather and maternal grandmother; Heart disease in his paternal grandfather; Parkinson's disease in his maternal grandfather.    ROS:  Please see the  history of present illness.   Otherwise, review of systems are positive for none.   All other systems are reviewed and negative.    PHYSICAL EXAM: VS:  BP 120/74   Pulse 66   Ht 6\' 1"  (1.854 m)   Wt 90.2 kg (198 lb 12.8 oz)   BMI 26.23 kg/m  , BMI Body mass index is 26.23 kg/m. GENERAL:  Well appearing HEENT: Pupils equal round and reactive, fundi not visualized, oral mucosa unremarkable NECK:  No jugular venous distention, waveform within normal limits, carotid upstroke brisk and symmetric, no bruits LUNGS:  Clear to auscultation bilaterally HEART:  RRR.  PMI not displaced  or sustained,S1 and S2 within normal limits, no S3, no S4, no clicks, no rubs, no murmurs ABD:  Flat, positive bowel sounds normal in frequency in pitch, no bruits, no rebound, no guarding, no midline pulsatile mass, no hepatomegaly, no splenomegaly EXT:  2 plus pulses throughout, no edema, no cyanosis no clubbing SKIN:  No rashes no nodules NEURO:  Cranial nerves II through XII grossly intact, motor grossly intact throughout PSYCH:  Cognitively intact, oriented to person place and time   EKG:  EKG is not ordered today. The ekg ordered 11/09/13 demonstrates sinus rhythm. Rate 72 bpm.   ETT 12/03/16:  Negative for ischemia.  13.8 METS on a Bruce protocol.  Echo 11/28/16:  - Left ventricle: The cavity size was normal. There was moderate   focal basal and mild concentric hypertrophy. Systolic function   was normal. The estimated ejection fraction was in the range of   50% to 55%. Wall motion was normal; there were no regional wall   motion abnormalities. Left ventricular diastolic function   parameters were normal. - Aortic valve: Bicuspid; mildly thickened, mildly calcified   leaflets. There was mild regurgitation.  Recent Labs: 11/09/2016: BUN 12; Creatinine, Ser 1.21; Hemoglobin 13.8; Platelets 252; Potassium 4.0; Sodium 135 11/14/2016: TSH 1.430    Lipid Panel    Component Value Date/Time   CHOL 184 11/14/2016 1150   TRIG 85 11/14/2016 1150   HDL 50 11/14/2016 1150   CHOLHDL 3.7 11/14/2016 1150   LDLCALC 117 (H) 11/14/2016 1150      Wt Readings from Last 3 Encounters:  02/28/17 90.2 kg (198 lb 12.8 oz)  11/14/16 85.2 kg (187 lb 12.8 oz)  11/09/16 83 kg (183 lb)      ASSESSMENT AND PLAN:  # Bicuspid aortic valve: No aortic stenosis and no symptoms.  Repeat echo in 1 year.   # Syncope:  This episode was likely triggered by intravascular volume depletion and heavy caffeine intake.  No recurrent episodes.   # Palpitations: Resolved after limiting caffeine.   # CV  Disease Prevention: LDL 117 10/2016.   Current medicines are reviewed at length with the patient today.  The patient does not have concerns regarding medicines.  The following changes have been made:  no change  Labs/ tests ordered today include:   Orders Placed This Encounter  Procedures  . ECHOCARDIOGRAM COMPLETE   Time spent: 20 minutes-Greater than 50% of this time was spent in counseling, explanation of diagnosis, planning of further management, and coordination of care.   Disposition:   FU with Keyen Marban C. Duke Salviaandolph, MD, [facility] in 1 year.   This note was written with the assistance of speech recognition software.  Please excuse any transcriptional errors.  Signed, Mikyle Sox C. Duke Salviaandolph, MD, [facility]  02/28/2017 8:17 AM    [facility]

## 2017-02-28 NOTE — Patient Instructions (Signed)
Medication Instructions:  Your physician recommends that you continue on your current medications as directed. Please refer to the Current Medication list given to you today.  Labwork: none  Testing/Procedures: Your physician has requested that you have an echocardiogram. Echocardiography is a painless test that uses sound waves to create images of your heart. It provides your doctor with information about the size and shape of your heart and how well your heart's chambers and valves are working. This procedure takes approximately one hour. There are no restrictions for this procedure. [facility] [address]  Follow-Up: Your physician wants you to follow-up in: 1 YEAR AFTER ECHO  You will receive a reminder letter in the mail two months in advance. If you don't receive a letter, please call our office to schedule the follow-up appointment.  If you need a refill on your cardiac medications before your next appointment, please call your pharmacy.

## 2017-03-05 ENCOUNTER — Encounter: Payer: Self-pay | Admitting: Neurology

## 2017-03-05 ENCOUNTER — Ambulatory Visit (INDEPENDENT_AMBULATORY_CARE_PROVIDER_SITE_OTHER): Payer: BLUE CROSS/BLUE SHIELD | Admitting: Neurology

## 2017-03-05 DIAGNOSIS — R55 Syncope and collapse: Secondary | ICD-10-CM

## 2017-03-05 NOTE — Progress Notes (Signed)
PATIENT: Douglas Marquez DOB: 18-Nov-1980  Chief Complaint  Patient presents with  . New Patient (Initial Visit)    referring: Dr. Wylene Simmerisovec, PCP: Dr. Wylene Simmerisovec  . Back Pain    back pain radiating down right leg, started this year, worse after activity  . Loss of Consciousness    some episodes of syncope at the gym, sometimes at home, [facility], has seen cardiology     HISTORICAL  Douglas Marquez is a 36 year old male, seen in refer by his primary care doctor Tisovec, Richard for evaluation of back pain, episode of loss of consciousness, initial evaluation was on March 05 2017  He reported history of 3-4 major episode of passing out since 2016,  The most recent episode was in June 2018, he woke up early morning showing 4-5 cups of coffee, before he went to his regular morning workout with personal trainer, doing a workout, he complains of lightheadedness, dizziness, after he was lying down on the floor with leg raised up, he had transient loss of consciousness, paramedic was called, per patient, there was no significant abnormality on the vital signs when he was checked, he was described as eyes opening, staring into space, no seizure-like activity,  Episode in January 2018, he began to experience sudden onset of radiating pain at the right inner thigh, this reminded him of his previous history of testicular torsion pain, he became extremely nervous, then had transient pass out,  Christmas of 2017, after touching some wines, he slept on the sofa, when he will wake up from a few hours neck, trying to get up, he felt lightheaded, fell to the floor, no seizure-like activity reported,  In September 2017, while jumping on the trampoline, he landed wrong, had a severe sharp radiating pain at the upper back, x-ray on March 06 2016 showed acute T11 fracture with 10% height loss  He was seen by cardiologist already, echocardiogram and stress test showed no significant abnormality, no  cardiac monitoring was provided, EKG was normal.  I reviewed laboratory evaluation normal CBC CMP, TSH with exception of mild elevated LDL EKG,   REVIEW OF SYSTEMS: Full 14 system review of systems performed and notable only for as above  ALLERGIES: No Known Allergies  HOME MEDICATIONS: Current Outpatient Prescriptions  Medication Sig Dispense Refill  . calcium carbonate (TUMS - DOSED IN MG ELEMENTAL CALCIUM) 500 MG chewable tablet Chew 2 tablets by mouth daily as needed for indigestion or heartburn.    Marland Kitchen. ibuprofen (ADVIL,MOTRIN) 200 MG tablet Take 400 mg by mouth every 6 (six) hours as needed for headache or moderate pain.    . Multiple Vitamin (MULTIVITAMIN WITH MINERALS) TABS tablet Take 1 tablet by mouth daily.    Marland Kitchen. tretinoin (RETIN-A) 0.1 % cream Apply 1 application topically daily as needed (acne).   0   No current facility-administered medications for this visit.     PAST MEDICAL HISTORY: Past Medical History:  Diagnosis Date  . Acne   . Bicuspid aortic valve 02/28/2017  . Heart murmur   . History of concussion    12/ 2016  no residual   . Palpitations 11/14/2016  . PONV (postoperative nausea and vomiting)   . Syncope 11/14/2016  . Testicular torsion    intermittent bilaterally    PAST SURGICAL HISTORY: Past Surgical History:  Procedure Laterality Date  . ANTERIOR CRUCIATE LIGAMENT REPAIR Left 06/1999  . ORCHIOPEXY Bilateral 1990's    FAMILY HISTORY: Family History  Problem Relation Age  of Onset  . Cancer Maternal Grandmother   . Heart attack Maternal Grandmother   . Parkinson's disease Maternal Grandfather   . Heart attack Maternal Grandfather   . Cancer Paternal Grandfather   . Heart disease Paternal Grandfather     SOCIAL HISTORY:  Social History   Social History  . Marital status: Married    Spouse name: N/A  . Number of children: N/A  . Years of education: N/A   Occupational History  . Not on file.   Social History Main Topics  . Smoking  status: Former Smoker    Years: 2.00    Types: Cigarettes    Quit date: 07/11/2008  . Smokeless tobacco: Never Used  . Alcohol use Yes     Comment: RARE  . Drug use: No  . Sexual activity: Not on file   Other Topics Concern  . Not on file   Social History Narrative  . No narrative on file     PHYSICAL EXAM   Vitals:   03/05/17 0732  BP: 136/90  Pulse: 66  Weight: 198 lb (89.8 kg)  Height:  (1.854 m)    Not recorded      Body mass index is 26.12 kg/m.  PHYSICAL EXAMNIATION:  Gen: NAD, conversant, well nourised, obese, well groomed                     Cardiovascular: Regular rate rhythm, no peripheral edema, warm, nontender. Eyes: Conjunctivae clear without exudates or hemorrhage Neck: Supple, no carotid bruits. Pulmonary: Clear to auscultation bilaterally   NEUROLOGICAL EXAM:  MENTAL STATUS: Speech:    Speech is normal; fluent and spontaneous with normal comprehension.  Cognition:     Orientation to time, place and person     Normal recent and remote memory     Normal Attention span and concentration     Normal Language, naming, repeating,spontaneous speech     Fund of knowledge   CRANIAL NERVES: CN II: Visual fields are full to confrontation. Fundoscopic exam is normal with sharp discs and no vascular changes. Pupils are round equal and briskly reactive to light. CN III, IV, VI: extraocular movement are normal. No ptosis. CN V: Facial sensation is intact to pinprick in all 3 divisions bilaterally. Corneal responses are intact.  CN VII: Face is symmetric with normal eye closure and smile. CN VIII: Hearing is normal to rubbing fingers CN IX, X: Palate elevates symmetrically. Phonation is normal. CN XI: Head turning and shoulder shrug are intact CN XII: Tongue is midline with normal movements and no atrophy.  MOTOR: There is no pronator drift of out-stretched arms. Muscle bulk and tone are normal. Muscle strength is normal.  REFLEXES: Reflexes are  2+ and symmetric at the biceps, triceps, knees, and ankles. Plantar responses are flexor.  SENSORY: Intact to light touch, pinprick, positional sensation and vibratory sensation are intact in fingers and toes.  COORDINATION: Rapid alternating movements and fine finger movements are intact. There is no dysmetria on finger-to-nose and heel-knee-shin.    GAIT/STANCE: Posture is normal. Gait is steady with normal steps, base, arm swing, and turning. Heel and toe walking are normal. Tandem gait is normal.  Romberg is absent.   DIAGNOSTIC DATA (LABS, IMAGING, TESTING) - I reviewed patient records, labs, notes, testing and imaging myself where available.   ASSESSMENT AND PLAN  Kaien Pezzullo is a 36 y.o. male   Recurrent passing out episodes  Need to rule out central nervous system etiology,  remote possibility of seizure  MRI of the brain with and without contrast  EEG  Document all the event   Levert Feinstein, M.D. Ph.D.  [facility] [address] [place], [place] [address] Ph: [phone] Fax: [phone]  CC: Tisovec, Adelfa Koh, MD

## 2017-03-13 ENCOUNTER — Ambulatory Visit (INDEPENDENT_AMBULATORY_CARE_PROVIDER_SITE_OTHER): Payer: BLUE CROSS/BLUE SHIELD | Admitting: Neurology

## 2017-03-13 DIAGNOSIS — R55 Syncope and collapse: Secondary | ICD-10-CM

## 2017-03-14 NOTE — Procedures (Signed)
   HISTORY: 36 year old male, with history of sudden loss of consciousness,  TECHNIQUE:  16 channel EEG was performed based on standard 10-16 international system. One channel was dedicated to EKG, which has demonstrates normal sinus rhythm of 60 beats per minutes.  Upon awakening, the posterior background activity was well-developed, in alpha range, 10 Hz, reactive to eye opening and closure.  There was no evidence of epileptiform discharge.  Photic stimulation was performed, which induced a symmetric photic driving.  Hyperventilation was performed, there was no abnormality elicit.  No sleep was achieved.  CONCLUSION: This is a  normal awake EEG.  There is no electrodiagnostic evidence of epileptiform discharge.  Levert Feinstein, M.D. Ph.D.  [facility] [address] [place], [place] [address] Phone: [phone] Fax:      [phone]

## 2017-07-22 DIAGNOSIS — F4323 Adjustment disorder with mixed anxiety and depressed mood: Secondary | ICD-10-CM | POA: Diagnosis not present

## 2017-09-23 DIAGNOSIS — F4323 Adjustment disorder with mixed anxiety and depressed mood: Secondary | ICD-10-CM | POA: Diagnosis not present

## 2017-10-20 DIAGNOSIS — J029 Acute pharyngitis, unspecified: Secondary | ICD-10-CM | POA: Diagnosis not present

## 2017-10-23 DIAGNOSIS — Z6824 Body mass index (BMI) 24.0-24.9, adult: Secondary | ICD-10-CM | POA: Diagnosis not present

## 2017-10-23 DIAGNOSIS — Z1389 Encounter for screening for other disorder: Secondary | ICD-10-CM | POA: Diagnosis not present

## 2017-10-23 DIAGNOSIS — J029 Acute pharyngitis, unspecified: Secondary | ICD-10-CM | POA: Diagnosis not present

## 2018-02-16 DIAGNOSIS — F4323 Adjustment disorder with mixed anxiety and depressed mood: Secondary | ICD-10-CM | POA: Diagnosis not present

## 2018-02-25 DIAGNOSIS — M7581 Other shoulder lesions, right shoulder: Secondary | ICD-10-CM | POA: Diagnosis not present

## 2018-03-04 ENCOUNTER — Ambulatory Visit (HOSPITAL_COMMUNITY): Payer: BLUE CROSS/BLUE SHIELD

## 2018-03-06 ENCOUNTER — Other Ambulatory Visit: Payer: Self-pay

## 2018-03-06 ENCOUNTER — Ambulatory Visit (HOSPITAL_COMMUNITY): Payer: BLUE CROSS/BLUE SHIELD | Attending: Cardiology

## 2018-03-06 DIAGNOSIS — R011 Cardiac murmur, unspecified: Secondary | ICD-10-CM | POA: Diagnosis not present

## 2018-03-06 DIAGNOSIS — Q231 Congenital insufficiency of aortic valve: Secondary | ICD-10-CM

## 2018-03-06 DIAGNOSIS — I080 Rheumatic disorders of both mitral and aortic valves: Secondary | ICD-10-CM | POA: Insufficient documentation

## 2018-03-12 ENCOUNTER — Other Ambulatory Visit: Payer: Self-pay

## 2018-03-12 DIAGNOSIS — Q231 Congenital insufficiency of aortic valve: Secondary | ICD-10-CM

## 2018-03-12 DIAGNOSIS — R011 Cardiac murmur, unspecified: Secondary | ICD-10-CM

## 2018-03-24 DIAGNOSIS — B360 Pityriasis versicolor: Secondary | ICD-10-CM | POA: Diagnosis not present

## 2018-06-16 DIAGNOSIS — E349 Endocrine disorder, unspecified: Secondary | ICD-10-CM | POA: Diagnosis not present

## 2018-06-16 DIAGNOSIS — R102 Pelvic and perineal pain: Secondary | ICD-10-CM | POA: Diagnosis not present

## 2018-06-16 DIAGNOSIS — M549 Dorsalgia, unspecified: Secondary | ICD-10-CM | POA: Diagnosis not present

## 2018-07-01 DIAGNOSIS — R1031 Right lower quadrant pain: Secondary | ICD-10-CM | POA: Diagnosis not present

## 2018-07-01 DIAGNOSIS — M6281 Muscle weakness (generalized): Secondary | ICD-10-CM | POA: Diagnosis not present

## 2018-07-01 DIAGNOSIS — R102 Pelvic and perineal pain: Secondary | ICD-10-CM | POA: Diagnosis not present

## 2018-07-01 DIAGNOSIS — M62838 Other muscle spasm: Secondary | ICD-10-CM | POA: Diagnosis not present

## 2018-07-13 DIAGNOSIS — R102 Pelvic and perineal pain: Secondary | ICD-10-CM | POA: Diagnosis not present

## 2018-07-13 DIAGNOSIS — M62838 Other muscle spasm: Secondary | ICD-10-CM | POA: Diagnosis not present

## 2018-07-13 DIAGNOSIS — R1031 Right lower quadrant pain: Secondary | ICD-10-CM | POA: Diagnosis not present

## 2018-07-13 DIAGNOSIS — M6281 Muscle weakness (generalized): Secondary | ICD-10-CM | POA: Diagnosis not present

## 2018-07-24 DIAGNOSIS — M62838 Other muscle spasm: Secondary | ICD-10-CM | POA: Diagnosis not present

## 2018-07-24 DIAGNOSIS — M6281 Muscle weakness (generalized): Secondary | ICD-10-CM | POA: Diagnosis not present

## 2018-07-24 DIAGNOSIS — R102 Pelvic and perineal pain: Secondary | ICD-10-CM | POA: Diagnosis not present

## 2018-07-24 DIAGNOSIS — R1031 Right lower quadrant pain: Secondary | ICD-10-CM | POA: Diagnosis not present

## 2018-08-03 DIAGNOSIS — R1031 Right lower quadrant pain: Secondary | ICD-10-CM | POA: Diagnosis not present

## 2018-08-03 DIAGNOSIS — M62838 Other muscle spasm: Secondary | ICD-10-CM | POA: Diagnosis not present

## 2018-08-03 DIAGNOSIS — R102 Pelvic and perineal pain: Secondary | ICD-10-CM | POA: Diagnosis not present

## 2018-08-03 DIAGNOSIS — M6281 Muscle weakness (generalized): Secondary | ICD-10-CM | POA: Diagnosis not present

## 2018-08-10 DIAGNOSIS — R102 Pelvic and perineal pain: Secondary | ICD-10-CM | POA: Diagnosis not present

## 2018-08-10 DIAGNOSIS — M6281 Muscle weakness (generalized): Secondary | ICD-10-CM | POA: Diagnosis not present

## 2018-08-10 DIAGNOSIS — M62838 Other muscle spasm: Secondary | ICD-10-CM | POA: Diagnosis not present

## 2018-08-10 DIAGNOSIS — R1031 Right lower quadrant pain: Secondary | ICD-10-CM | POA: Diagnosis not present

## 2018-08-26 DIAGNOSIS — R102 Pelvic and perineal pain: Secondary | ICD-10-CM | POA: Diagnosis not present

## 2018-08-26 DIAGNOSIS — M6281 Muscle weakness (generalized): Secondary | ICD-10-CM | POA: Diagnosis not present

## 2018-08-26 DIAGNOSIS — R1031 Right lower quadrant pain: Secondary | ICD-10-CM | POA: Diagnosis not present

## 2018-08-26 DIAGNOSIS — M62838 Other muscle spasm: Secondary | ICD-10-CM | POA: Diagnosis not present

## 2018-09-15 DIAGNOSIS — B349 Viral infection, unspecified: Secondary | ICD-10-CM | POA: Diagnosis not present

## 2018-09-15 DIAGNOSIS — R52 Pain, unspecified: Secondary | ICD-10-CM | POA: Diagnosis not present

## 2018-09-17 DIAGNOSIS — R509 Fever, unspecified: Secondary | ICD-10-CM | POA: Diagnosis not present

## 2018-09-17 DIAGNOSIS — Z20818 Contact with and (suspected) exposure to other bacterial communicable diseases: Secondary | ICD-10-CM | POA: Diagnosis not present

## 2018-09-17 DIAGNOSIS — R0602 Shortness of breath: Secondary | ICD-10-CM | POA: Diagnosis not present

## 2018-09-17 DIAGNOSIS — B349 Viral infection, unspecified: Secondary | ICD-10-CM | POA: Diagnosis not present

## 2018-09-24 DIAGNOSIS — H60501 Unspecified acute noninfective otitis externa, right ear: Secondary | ICD-10-CM | POA: Diagnosis not present

## 2018-09-24 DIAGNOSIS — Z20818 Contact with and (suspected) exposure to other bacterial communicable diseases: Secondary | ICD-10-CM | POA: Diagnosis not present

## 2018-09-24 DIAGNOSIS — R0602 Shortness of breath: Secondary | ICD-10-CM | POA: Diagnosis not present

## 2018-09-24 DIAGNOSIS — R509 Fever, unspecified: Secondary | ICD-10-CM | POA: Diagnosis not present

## 2018-11-12 DIAGNOSIS — Z03818 Encounter for observation for suspected exposure to other biological agents ruled out: Secondary | ICD-10-CM | POA: Diagnosis not present

## 2018-11-30 DIAGNOSIS — B360 Pityriasis versicolor: Secondary | ICD-10-CM | POA: Diagnosis not present

## 2019-03-08 ENCOUNTER — Ambulatory Visit (HOSPITAL_COMMUNITY): Payer: BC Managed Care – PPO | Attending: Cardiology

## 2019-03-08 ENCOUNTER — Other Ambulatory Visit: Payer: Self-pay

## 2019-03-08 DIAGNOSIS — Q231 Congenital insufficiency of aortic valve: Secondary | ICD-10-CM

## 2019-03-08 DIAGNOSIS — R011 Cardiac murmur, unspecified: Secondary | ICD-10-CM | POA: Diagnosis not present

## 2019-03-24 ENCOUNTER — Encounter: Payer: Self-pay | Admitting: *Deleted

## 2019-03-26 ENCOUNTER — Other Ambulatory Visit: Payer: Self-pay

## 2019-03-26 ENCOUNTER — Ambulatory Visit: Payer: BC Managed Care – PPO | Admitting: Cardiovascular Disease

## 2019-03-26 ENCOUNTER — Encounter: Payer: Self-pay | Admitting: Cardiovascular Disease

## 2019-03-26 VITALS — BP 138/90 | Ht 73.0 in | Wt 218.8 lb

## 2019-03-26 DIAGNOSIS — E785 Hyperlipidemia, unspecified: Secondary | ICD-10-CM | POA: Diagnosis not present

## 2019-03-26 DIAGNOSIS — Q231 Congenital insufficiency of aortic valve: Secondary | ICD-10-CM

## 2019-03-26 DIAGNOSIS — I10 Essential (primary) hypertension: Secondary | ICD-10-CM

## 2019-03-26 DIAGNOSIS — Q2381 Bicuspid aortic valve: Secondary | ICD-10-CM

## 2019-03-26 HISTORY — DX: Essential (primary) hypertension: I10

## 2019-03-26 NOTE — Patient Instructions (Addendum)
Medication Instructions:  Your physician recommends that you continue on your current medications as directed. Please refer to the Current Medication list given to you today.  If you need a refill on your cardiac medications before your next appointment, please call your pharmacy.   Lab work: FASTING LIPID SOON   Testing/Procedures: CALCIUM SCORE, $150 OUT OF POCKET  THIS WILL BE DONE AT [facility] AT [address]   Follow-Up: At [facility], you and your health needs are our priority.  As part of our continuing mission to provide you with exceptional heart care, we have created designated Provider Care Teams.  These Care Teams include your primary Cardiologist (physician) and Advanced Practice Providers (APPs -  Physician Assistants and Nurse Practitioners) who all work together to provide you with the care you need, when you need it. You will need a follow up appointment in 3 months. You may see DR [facility] or one of the following Advanced Practice Providers on your designated Care Team:   Kerin Ransom, PA-C Roby Lofts, [place] . Sande Rives, PA-C  Any Other Special Instructions Will Be Listed Below (If Applicable). WORK ON DIET AND EXERCISE

## 2019-03-26 NOTE — Addendum Note (Signed)
Addended by: Alvina Filbert B on: 03/26/2019 12:19 PM   Modules accepted: Orders

## 2019-03-26 NOTE — Progress Notes (Signed)
Cardiology Office Note   Date:  03/26/2019   ID:  Douglas Marquez, DOB 05-Sep-1980, MRN [id number]  PCP:  Gaspar Marquez, Douglas W, MD  Cardiologist:   Douglas Marquez , MD   No chief complaint on file.    History of Present Illness: Douglas Marquez is a 38 y.o. male with bicuspid aortic valve and recurrent syncope who presents for follow-up. He was initially seen 10/2016 foran evaluation of syncope.  Mr. Douglas Marquez was seen in the ED 11/09/16 with an episode of syncope that occurred after working out.  That morning he awakened early and drinks 6 cups of coffee. On average he drinks 4 or 5 cups of coffee daily.  Once the EMS workers arrived his blood pressure was reportedly in the 90s systolic. He had another syncopal episode while talking with them. At his appointment he reported atypical chest pain and was referred for an ETT that was negative for ischemia. He achieved 13.8 METS on a Bruce protocol. He was noted to have a systolic murmur and was referred for an echocardiogram 11/28/16 that revealed LVEF 50-55% and a bicuspid aortic valve with mild aortic regurgitation.  Mr. Douglas Marquez has a family history of premature CAD. Both maternal grandparents had heart attacks in their 6240s.  Since his last appointment Mr. Douglas Marquez has been doing okay.  He has been working from home due to the coronavirus.  In that time he has gained 30 pounds since he has been mostly sitting at his desk.  He has not been getting much exercise.  He is noticed the effects of this weight.  He has been feeling more sluggish and is little bit more short of breath when he tries to exert himself.  He has no exertional chest pain or pressure.  He also notices that his blood pressure has been running a little bit high at home.  He is unsure of the exact values but the machine gives yellow and red flags.  He reports some mild GERD as well.  He has no lower extremity edema.  He also denies any lightheadedness, dizziness, or syncope.  He had a repeat echo  03/08/2019 that revealed LVEF 60 to 65% with moderate asymmetric LVH.  Septal wall was 1.4 mm.  There is fusion of the right and left coronary cusp of the aortic valve but no aortic stenosis.  Peak velocity was less than 2 m/s.   Past Medical History:  Diagnosis Date  . Acne   . Bicuspid aortic valve 02/28/2017  . Essential hypertension 03/26/2019  . Heart murmur   . History of concussion    12/ 2016  no residual   . Palpitations 11/14/2016  . PONV (postoperative nausea and vomiting)   . Syncope 11/14/2016  . Testicular torsion    intermittent bilaterally    Past Surgical History:  Procedure Laterality Date  . ANTERIOR CRUCIATE LIGAMENT REPAIR Left 06/1999  . ORCHIOPEXY Bilateral 1990's     Current Outpatient Medications  Medication Sig Dispense Refill  . calcium carbonate (TUMS - DOSED IN MG ELEMENTAL CALCIUM) 500 MG chewable tablet Chew 2 tablets by mouth daily as needed for indigestion or heartburn.    Marland Kitchen. ibuprofen (ADVIL,MOTRIN) 200 MG tablet Take 400 mg by mouth every 6 (six) hours as needed for headache or moderate pain.    . Multiple Vitamin (MULTIVITAMIN WITH MINERALS) TABS tablet Take 1 tablet by mouth daily.    Marland Kitchen. tretinoin (RETIN-A) 0.1 % cream Apply 1 application topically daily as needed (acne).  0   No current facility-administered medications for this visit.     Allergies:   Patient has no known allergies.    Social History:  The patient  reports that he quit smoking about 10 years ago. His smoking use included cigarettes. He quit after 2.00 years of use. He has never used smokeless tobacco. He reports current alcohol use. He reports that he does not use drugs.   Family History:  The patient's family history includes Cancer in his maternal grandmother and paternal grandfather; Heart attack in his maternal grandfather and maternal grandmother; Heart disease in his paternal grandfather; Parkinson's disease in his maternal grandfather.    ROS:  Please see the history  of present illness.   Otherwise, review of systems are positive for none.   All other systems are reviewed and negative.    PHYSICAL EXAM: VS:  BP 138/90   Ht 6\' 1"  (1.854 m)   Wt 218 lb 12.8 oz (99.2 kg)   BMI 28.87 kg/m  , BMI Body mass index is 28.87 kg/m. GENERAL:  Well appearing HEENT: Pupils equal round and reactive, fundi not visualized, oral mucosa unremarkable NECK:  No jugular venous distention, waveform within normal limits, carotid upstroke brisk and symmetric, no bruits LUNGS:  Clear to auscultation bilaterally HEART:  RRR.  PMI not displaced or sustained,S1 and S2 within normal limits, no S3, no S4, no clicks, no rubs, no murmurs ABD:  Flat, positive bowel sounds normal in frequency in pitch, no bruits, no rebound, no guarding, no midline pulsatile mass, no hepatomegaly, no splenomegaly EXT:  2 plus pulses throughout, no edema, no cyanosis no clubbing SKIN:  No rashes no nodules NEURO:  Cranial nerves II through XII grossly intact, motor grossly intact throughout PSYCH:  Cognitively intact, oriented to person place and time   EKG:  EKG is ordered today. The ekg ordered 11/09/13 demonstrates sinus rhythm. Rate 72 bpm.  03/26/19: Sinus rhythm.  Rate 60 bpm.  Left axis deviation.  ETT 12/03/16:  Negative for ischemia.  13.8 METS on a Bruce protocol.  Echo 11/28/16:  - Left ventricle: The cavity size was normal. There was moderate   focal basal and mild concentric hypertrophy. Systolic function   was normal. The estimated ejection fraction was in the range of   50% to 55%. Wall motion was normal; there were no regional wall   motion abnormalities. Left ventricular diastolic function   parameters were normal. - Aortic valve: Bicuspid; mildly thickened, mildly calcified   leaflets. There was mild regurgitation.  Echo 02/2019:   LVEF 60 to 65%.  Moderate asymmetric hypertrophy of the septal wall.  Bicuspid aortic valve.  Mild thickening and calcification.  Fusion of the  right and left coronary cusps.  Mild ascending aortic aneurysm.  Echo 03/08/19: IMPRESSIONS    1. The left ventricle has normal systolic function with an ejection fraction of 60-65%. The cavity size was normal. There is moderate asymmetric left ventricular hypertrophy of the septal wall. Left ventricular diastolic parameters were normal. No  evidence of left ventricular regional wall motion abnormalities.  2. The right ventricle has normal systolic function. The cavity was normal. There is no increase in right ventricular wall thickness.  3. Small pericardial effusion.  4. The pericardial effusion is posterior.  5. No evidence of mitral valve stenosis.  6. The aortic valve is bicuspid. Mild thickening of the aortic valve. Mild calcification of the aortic valve. Aortic valve regurgitation is mild by color flow Doppler. No stenosis  of the aortic valve.  7. Fusion of R-L coronary cusps.  8. The aorta is normal unless otherwise noted.  9. The aortic root and aortic arch are normal in size and structure. 10. There is mild dilatation of the ascending aorta measuring 39 mm.   Recent Labs: No results found for requested labs within last 8760 hours.    Lipid Panel    Component Value Date/Time   CHOL 184 11/14/2016 1150   TRIG 85 11/14/2016 1150   HDL 50 11/14/2016 1150   CHOLHDL 3.7 11/14/2016 1150   LDLCALC 117 (H) 11/14/2016 1150      Wt Readings from Last 3 Encounters:  03/26/19 218 lb 12.8 oz (99.2 kg)  03/05/17 198 lb (89.8 kg)  02/28/17 198 lb 12.8 oz (90.2 kg)      ASSESSMENT AND PLAN:  # Bicuspid aortic valve:  # Moderate septal hypertrophy: No aortic stenosis and no symptoms.  Repeat echo in 1 year.   # FH premature CAD: Coronary calcium score  # Palpitations: Resolved after limiting caffeine.   # CV Disease Prevention: LDL 117 10/2016.  Repeat lipids/CMP.  Increase exercise to 150 min/week.  # Hypertension: Blood pressure has been elevated both here and at home.   He wants to work on diet and exercise.  He will focus on limiting his sodium to 2 g daily.  Increase exercise as above.  Calcium score  Current medicines are reviewed at length with the patient today.  The patient does not have concerns regarding medicines.  The following changes have been made:  no change  Labs/ tests ordered today include:   Orders Placed This Encounter  Procedures  . CT CARDIAC SCORING  . Lipid panel     Disposition:   FU with Jadie Allington C. Duke Salvia, MD, [facility] [place] [facility] in 3 months.   This note was written with the assistance of speech recognition software.  Please excuse any transcriptional errors.  Signed, Salomon Ganser C. Duke Salvia, MD, [facility]  03/26/2019 10:06 AM    [facility]

## 2019-03-27 LAB — LIPID PANEL
Chol/HDL Ratio: 5.9 ratio — ABNORMAL HIGH (ref 0.0–5.0)
Cholesterol, Total: 212 mg/dL — ABNORMAL HIGH (ref 100–199)
HDL: 36 mg/dL — ABNORMAL LOW (ref >39)
LDL Chol Calc (NIH): 141 mg/dL — ABNORMAL HIGH (ref 0–99)
Triglycerides: 194 mg/dL — ABNORMAL HIGH (ref 0–149)
VLDL Cholesterol Cal: 35 mg/dL (ref 5–40)

## 2019-04-29 ENCOUNTER — Ambulatory Visit (INDEPENDENT_AMBULATORY_CARE_PROVIDER_SITE_OTHER)
Admission: RE | Admit: 2019-04-29 | Discharge: 2019-04-29 | Disposition: A | Discharge status: Home / Self Care | Payer: BC Managed Care – PPO | Source: Ambulatory Visit | Attending: Cardiovascular Disease | Admitting: Cardiovascular Disease

## 2019-04-29 ENCOUNTER — Other Ambulatory Visit: Payer: Self-pay

## 2019-04-29 ENCOUNTER — Telehealth: Payer: Self-pay | Admitting: Cardiovascular Disease

## 2019-04-29 DIAGNOSIS — E785 Hyperlipidemia, unspecified: Secondary | ICD-10-CM

## 2019-04-29 DIAGNOSIS — Q231 Congenital insufficiency of aortic valve: Secondary | ICD-10-CM

## 2019-04-29 NOTE — Telephone Encounter (Signed)
Received call report from [facility]   IMPRESSION: Incomplete visualization of nodular ground-glass opacity in the anterior right upper lobe. Differential diagnosis includes infectious and inflammatory etiologies as well as low-grade adenocarcinoma. Recommend follow-up by chest CT without contrast in 3 months.   Will forward to Dr Oval Linsey for review

## 2019-04-30 ENCOUNTER — Encounter: Payer: Self-pay | Admitting: *Deleted

## 2019-04-30 NOTE — Telephone Encounter (Signed)
Follow Up  Patient is calling in foor CT results. Please give patient's wife/patient a call back.

## 2019-05-03 NOTE — Telephone Encounter (Addendum)
Left message to call back    Notes recorded by Skeet Latch, MD on 04/29/2019 at 5:44 PM EST  No coronary calcium. Normal coronaries. There is an area in the R upper lobe that looks a little abnormal. It can be seen with infection, inflammation and rarely cancer. Repeat non-contrast chest CT in 3 months to make sure it goes away.

## 2019-05-21 DIAGNOSIS — Z125 Encounter for screening for malignant neoplasm of prostate: Secondary | ICD-10-CM | POA: Diagnosis not present

## 2019-05-21 DIAGNOSIS — Z0000 Encounter for general adult medical examination without abnormal findings: Secondary | ICD-10-CM | POA: Diagnosis not present

## 2019-05-21 DIAGNOSIS — R7989 Other specified abnormal findings of blood chemistry: Secondary | ICD-10-CM | POA: Diagnosis not present

## 2019-05-27 DIAGNOSIS — Z0000 Encounter for general adult medical examination without abnormal findings: Secondary | ICD-10-CM | POA: Diagnosis not present

## 2019-05-27 DIAGNOSIS — R918 Other nonspecific abnormal finding of lung field: Secondary | ICD-10-CM | POA: Diagnosis not present

## 2019-05-27 DIAGNOSIS — Q231 Congenital insufficiency of aortic valve: Secondary | ICD-10-CM | POA: Diagnosis not present

## 2019-05-27 DIAGNOSIS — Z1331 Encounter for screening for depression: Secondary | ICD-10-CM | POA: Diagnosis not present

## 2019-05-27 DIAGNOSIS — G43009 Migraine without aura, not intractable, without status migrainosus: Secondary | ICD-10-CM | POA: Diagnosis not present

## 2019-05-27 DIAGNOSIS — K219 Gastro-esophageal reflux disease without esophagitis: Secondary | ICD-10-CM | POA: Diagnosis not present

## 2019-05-28 NOTE — Telephone Encounter (Signed)
Discussed with patient yesterday and his PCP will order follow up testing

## 2019-06-01 ENCOUNTER — Other Ambulatory Visit: Payer: Self-pay | Admitting: Internal Medicine

## 2019-06-01 DIAGNOSIS — R911 Solitary pulmonary nodule: Secondary | ICD-10-CM

## 2019-06-10 ENCOUNTER — Ambulatory Visit
Admission: RE | Admit: 2019-06-10 | Discharge: 2019-06-10 | Disposition: A | Discharge status: Home / Self Care | Payer: BC Managed Care – PPO | Source: Ambulatory Visit | Attending: Internal Medicine | Admitting: Internal Medicine

## 2019-06-10 DIAGNOSIS — R918 Other nonspecific abnormal finding of lung field: Secondary | ICD-10-CM | POA: Diagnosis not present

## 2019-06-10 DIAGNOSIS — R911 Solitary pulmonary nodule: Secondary | ICD-10-CM

## 2019-07-20 ENCOUNTER — Telehealth: Payer: Self-pay | Admitting: *Deleted

## 2019-07-20 NOTE — Telephone Encounter (Signed)
A message was left, re: his follow up visit. 

## 2019-08-16 NOTE — Progress Notes (Signed)
Virtual Visit via Telephone Note   This visit type was conducted due to national recommendations for restrictions regarding the COVID-19 Pandemic (e.g. social distancing) in an effort to limit this patient's exposure and mitigate transmission in our community.  Due to his co-morbid illnesses, this patient is at least at moderate risk for complications without adequate follow up.  This format is felt to be most appropriate for this patient at this time.  The patient did not have access to video technology/had technical difficulties with video requiring transitioning to audio format only (telephone).  All issues noted in this document were discussed and addressed.  No physical exam could be performed with this format.  Please refer to the patient's chart for his  consent to telehealth for [facility].  Evaluation Performed:  Follow-up visit  This visit type was conducted due to national recommendations for restrictions regarding the COVID-19 Pandemic (e.g. social distancing).  This format is felt to be most appropriate for this patient at this time.  All issues noted in this document were discussed and addressed.  No physical exam was performed (except for noted visual exam findings with Video Visits).  Please refer to the patient's chart (MyChart message for video visits and phone note for telephone visits) for the patient's consent to telehealth for [facility]  Date:  08/17/2019   ID:  Douglas Marquez, DOB September 21, 1980, MRN [id number]  Patient Location:  [place] [address]   Provider location:      [place] [address] Office [phone] Fax [phone]   PCP:  Haywood Pao, MD  Cardiologist:  Skeet Latch, MD  Electrophysiologist:  None   Chief Complaint: Follow-up  History of Present Illness:    Douglas Marquez is a 39 y.o. male who presents via audio/video conferencing for a telehealth visit today.   Patient verified DOB and address.  The patient does not symptoms concerning for COVID-19 infection (fever, chills, cough, or new SHORTNESS OF BREATH).   Douglas Marquez has a past medical history of a bicuspid aortic valve, recurrent syncope, palpitations, hypertension, and septal hypertrophy.  He has a family history of premature coronary artery disease.  He underwent a coronary CTA which showed calcium score of 0 04/29/2019.  However, his right anterior upper lobe showed nodular groundglass opacity.  Differential diagnosis of infection, inflammatory etiologies and low-grade adenocarcinoma were mention.  A follow-up CT chest 06/10/2019 showed groundglass opacities again seen in the upper right lobe which were believed to be slightly decreased compared to his prior CT.  No significant cardiovascular findings were noted.  In May 2018 he was seen for evaluation of syncope.  He presented to the emergency department after he had  an episode of syncope after working out.  The morning of the episode he had woken up early consumes 6 cups of coffee and went to the gym.  When EMS arrived his blood pressure was in the 24P systolic and he had and mother syncopal episode while talking with EMS providers.  During his cardiology evaluation he reported atypical chest pain.  He will underwent ETT which showed no ischemia.  He was able to achieve 13.8 METS of activity.  He was noted to have a systolic murmur and was referred for echocardiogram 11/28/2016.  His echocardiogram showed an LVEF of 50-55% and a bicuspid aortic valve with mild aortic regurgitation.  He was last seen by Dr. Oval Linsey on 03/26/2019.  During that time  he was doing okay.  He had been working at home due to the COVID-19 pandemic.  He also indicated that he had gained around 30 pounds due to being more sedentary.  He also has noticed that his blood pressure was also more elevated with his increased weight gain.  At that time he also reported some mild GERD type  symptoms.  He denied lower extremity edema, lightheadedness, dizziness, or syncope.  His repeat echocardiogram 03/08/2019 showed an LVEF of 60-65% with moderate asymmetric LVH, and a septal wall of 1.4 mm.  A fusion of the right and left coronary cusps of the aortic valve but no aortic stenosis and a peak velocity less than 2 m/s were noted.  He is seen virtually today for follow-up and states he continues to work at home due to the COVID-19 pandemic.  He states he is still sedentary and caring around an extra 25-30 pounds.  He has been occasionally walking his dogs.  However he has not been as active as he was before COVID-19, going to the gym, and playing tennis.  He has not been monitoring his blood pressure and states that he has also not yet attempted to change his diet.  When asked about starting medication he stated he is not ready to initiate any medication at this time.  He would still like to become more healthy through improving his diet and adopting an exercise routine.  He indicated that he also takes ibuprofen as needed for migraine.  I have instructed him to use ibuprofen sparingly.  I will send him the salty 6 sheet, information on lowering cholesterol through diet and exercise, and have him follow-up with [place] in 6 months.  Today he denies chest pain, shortness of breath, lower extremity edema, fatigue, palpitations, melena, hematuria, hemoptysis, diaphoresis, weakness, presyncope, syncope.   Prior CV studies:   The following studies were reviewed today: None today  EKG 03/26/2019 Normal sinus rhythm incomplete right bundle branch block 60 bpm  Echocardiogram 03/08/2019 IMPRESSIONS    1. The left ventricle has normal systolic function with an ejection  fraction of 60-65%. The cavity size was normal. There is moderate  asymmetric left ventricular hypertrophy of the septal wall. Left  ventricular diastolic parameters were normal. No  evidence of left ventricular regional wall motion  abnormalities.  2. The right ventricle has normal systolic function. The cavity was  normal. There is no increase in right ventricular wall thickness.  3. Small pericardial effusion.  4. The pericardial effusion is posterior.  5. No evidence of mitral valve stenosis.  6. The aortic valve is bicuspid. Mild thickening of the aortic valve.  Mild calcification of the aortic valve. Aortic valve regurgitation is mild  by color flow Doppler. No stenosis of the aortic valve.  7. Fusion of R-L coronary cusps.  8. The aorta is normal unless otherwise noted.  9. The aortic root and aortic arch are normal in size and structure.  10. There is mild dilatation of the ascending aorta measuring 39 mm.    ETT 12/03/2016 was negative for ischemia and 13.8 METS were achieved.   Past Medical History:  Diagnosis Date  . Acne   . Bicuspid aortic valve 02/28/2017  . Essential hypertension 03/26/2019  . Heart murmur   . History of concussion    12/ 2016  no residual   . Palpitations 11/14/2016  . PONV (postoperative nausea and vomiting)   . Syncope 11/14/2016  . Testicular torsion    intermittent bilaterally  Past Surgical History:  Procedure Laterality Date  . ANTERIOR CRUCIATE LIGAMENT REPAIR Left 06/1999  . ORCHIOPEXY Bilateral 1990's     Current Meds  Medication Sig  . calcium carbonate (TUMS - DOSED IN MG ELEMENTAL CALCIUM) 500 MG chewable tablet Chew 2 tablets by mouth daily as needed for indigestion or heartburn.  Marland Kitchen ibuprofen (ADVIL,MOTRIN) 200 MG tablet Take 400 mg by mouth every 6 (six) hours as needed for headache or moderate pain.  . Multiple Vitamin (MULTIVITAMIN WITH MINERALS) TABS tablet Take 1 tablet by mouth daily.  Marland Kitchen tretinoin (RETIN-A) 0.1 % cream Apply 1 application topically daily as needed (acne).      Allergies:   Patient has no known allergies.   Social History   Tobacco Use  . Smoking status: Former Smoker    Years: 2.00    Types: Cigarettes    Quit date:  07/11/2008    Years since quitting: 11.1  . Smokeless tobacco: Never Used  Substance Use Topics  . Alcohol use: Yes    Comment: RARE  . Drug use: No     Family Hx: The patient's family history includes Cancer in his maternal grandmother and paternal grandfather; Heart attack in his maternal grandfather and maternal grandmother; Heart disease in his paternal grandfather; Parkinson's disease in his maternal grandfather.  ROS:   Please see the history of present illness.     All other systems reviewed and are negative.   Labs/Other Tests and Data Reviewed:    Recent Labs: No results found for requested labs within last 8760 hours.   Recent Lipid Panel Lab Results  Component Value Date/Time   CHOL 212 (H) 03/26/2019 09:25 AM   TRIG 194 (H) 03/26/2019 09:25 AM   HDL 36 (L) 03/26/2019 09:25 AM   CHOLHDL 5.9 (H) 03/26/2019 09:25 AM   LDLCALC 141 (H) 03/26/2019 09:25 AM    Wt Readings from Last 3 Encounters:  08/17/19 215 lb (97.5 kg)  03/26/19 218 lb 12.8 oz (99.2 kg)  03/05/17 198 lb (89.8 kg)     Exam:    Vital Signs:  BP (!) 148/99   Pulse 85   Ht 6\' 1"  (1.854 m)   Wt 215 lb (97.5 kg)   BMI 28.37 kg/m    Well nourished, well developed male in no  acute distress.   ASSESSMENT & PLAN:    1.  Bicuspid aortic valve-echocardiogram 03/08/2019 showed no aortic stenosis.  Continues to be asymptomatic at this time. Repeat echocardiogram in around 7 months.  Palpitations-no recent instances of palpitations.  Was consuming large amounts of caffeine daily. Continue to abstain from large quantities of caffeine. Avoid triggers caffeine, chocolate, EtOH, etc. Heart healthy low-sodium diet  Essential hypertension-BP today 148/99.  Wishes to lower blood pressure through improving his diet and an exercise regimen.  Not ready to initiate medication therapy at this time. Heart healthy low-sodium diet Increase physical activity as tolerated  Hyperlipidemia-03/26/2019:  Cholesterol, Total 212; HDL 36; LDL Chol Calc (NIH) 141; Triglycerides 194. Goal LDL less than 100 Heart healthy low-sodium high-fiber diet Lowering cholesterol handout given Increase physical activity as tolerated Repeat lipid panel in 6 months  Family history of premature coronary artery disease-calcium scoring was zero 04/29/2019.   COVID-19 Education: The signs and symptoms of COVID-19 were discussed with the patient and how to seek care for testing (follow up with PCP or arrange E-visit).  The importance of social distancing was discussed today.  Patient Risk:   After full review of  this patients clinical status, I feel that they are at least moderate risk at this time.  Time:   Today, I have spent 10 minutes with the patient with telehealth technology discussing diet, exercise, blood pressure medication, coronary artery disease, hypertension, COVID-19 virus/vaccine.  15 minutes spent reviewing patient's chart prior to visit and 5 minutes spent reviewing and completing patient documentation   Medication Adjustments/Labs and Tests Ordered: Current medicines are reviewed at length with the patient today.  Concerns regarding medicines are outlined above.   Tests Ordered: No orders of the defined types were placed in this encounter.  Medication Changes: No orders of the defined types were placed in this encounter.   Disposition:  in 7 month(s) with Dr. Duke Salvia or myself after his echocardiogram.  Signed, Thomasene Ripple. Radford Pease NP-C        [facility] [address] Office [phone] Fax [phone]

## 2019-08-17 ENCOUNTER — Telehealth (INDEPENDENT_AMBULATORY_CARE_PROVIDER_SITE_OTHER): Payer: 59 | Admitting: General Practice

## 2019-08-17 VITALS — BP 148/99 | HR 85 | Ht 73.0 in | Wt 215.0 lb

## 2019-08-17 DIAGNOSIS — Q231 Congenital insufficiency of aortic valve: Secondary | ICD-10-CM | POA: Diagnosis not present

## 2019-08-17 DIAGNOSIS — I10 Essential (primary) hypertension: Secondary | ICD-10-CM

## 2019-08-17 DIAGNOSIS — Z8249 Family history of ischemic heart disease and other diseases of the circulatory system: Secondary | ICD-10-CM | POA: Diagnosis not present

## 2019-08-17 DIAGNOSIS — R002 Palpitations: Secondary | ICD-10-CM

## 2019-08-17 DIAGNOSIS — E785 Hyperlipidemia, unspecified: Secondary | ICD-10-CM

## 2019-08-17 NOTE — Addendum Note (Signed)
Addended by: Alyson Ingles on: 08/17/2019 08:50 AM   Modules accepted: Orders

## 2019-08-17 NOTE — Patient Instructions (Addendum)
Testing: Echocardiogram (IN September) - Your physician has requested that you have an echocardiogram. Echocardiography is a painless test that uses sound waves to create images of your heart. It provides your doctor with information about the size and shape of your heart and how well your heart's chambers and valves are working. This procedure takes approximately one hour. There are no restrictions for this procedure. This will be performed at our [facility] location - [address].  Special Instructions: PLEASE READ AND FOLLOW CHOLESTEROL TIPS ATTACHED  PLEASE READ AND FOLLOW SALTY 6 ATTACHED  Follow-Up: AFTER ECHO IN SEPTEMBER Please call our office 2 months in advance, [place] to schedule this SEPT 2021 appointment. [place], [place].    At [facility], you and your health needs are our priority.  As part of our continuing mission to provide you with exceptional heart care, we have created designated Provider Care Teams.  These Care Teams include your primary Cardiologist (physician) and Advanced Practice Providers (APPs -  Physician Assistants and Nurse Practitioners) who all work together to provide you with the care you need, when you need it.  Reduce your risk of getting COVID-19 With your heart disease it is especially important for people at increased risk of severe illness from COVID-19, and those who live with them, to protect themselves from getting COVID-19. The best way to protect yourself and to help reduce the spread of the virus that causes COVID-19 is to: Marland Kitchen Limit your interactions with other people as much as possible. . Take COVID-19 when you do interact with others. If you start feeling sick and think you may have COVID-19, get in touch with your healthcare provider within 24 hours.  Thank you for choosing [facility] at [facility]!!        Fat and Cholesterol Restricted Eating Plan Getting too much fat and cholesterol in your diet may  cause health problems. Choosing the right foods helps keep your fat and cholesterol at normal levels. This can keep you from getting certain diseases.  Meal planning  At meals, divide your plate into four equal parts: ? Fill one-half of your plate with vegetables and green salads. ? Fill one-fourth of your plate with whole grains. ? Fill one-fourth of your plate with low-fat (lean) protein foods.  Eat fish that is high in omega-3 fats at least two times a week. This includes mackerel, tuna, sardines, and salmon.  Eat foods that are high in fiber, such as whole grains, beans, apples, broccoli, carrots, peas, and barley.  General tips  Work with your doctor to lose weight if you need to.  Avoid: ? Foods with added sugar. ? Fried foods. ? Foods with partially hydrogenated oils.  Limit alcohol intake to no more than 1 drink a day for nonpregnant women and 2 drinks a day for men. One drink equals 12 oz of beer, 5 oz of wine, or 1 oz of hard liquor.  Reading food labels  Check food labels for: ? Trans fats. ? Partially hydrogenated oils. ? Saturated fat (g) in each serving. ? Cholesterol (mg) in each serving. ? Fiber (g) in each serving.  Choose foods with healthy fats, such as: ? Monounsaturated fats. ? Polyunsaturated fats. ? Omega-3 fats.  Choose grain products that have whole grains. Look for the word "whole" as the first word in the ingredient list. Cooking  Cook foods using low-fat methods. These include baking, boiling, grilling, and broiling.  Eat more home-cooked  foods. Eat at restaurants and buffets less often.  Avoid cooking using saturated fats, such as butter, cream, palm oil, palm kernel oil, and coconut oil. Recommended foods  Fruits  All fresh, canned (in natural juice), or frozen fruits. Vegetables  Fresh or frozen vegetables (raw, steamed, roasted, or grilled). Green salads. Grains  Whole grains, such as whole wheat or whole grain breads, crackers,  cereals, and pasta. Unsweetened oatmeal, bulgur, barley, quinoa, or brown rice. Corn or whole wheat flour tortillas. Meats and other protein foods  Ground beef (85% or leaner), grass-fed beef, or beef trimmed of fat. Skinless chicken or [place]. Ground chicken or [place]. Pork trimmed of fat. All fish and seafood. Egg whites. Dried beans, peas, or lentils. Unsalted nuts or seeds. Unsalted canned beans. Nut butters without added sugar or oil. Dairy  Low-fat or nonfat dairy products, such as skim or 1% milk, 2% or reduced-fat cheeses, low-fat and fat-free ricotta or cottage cheese, or plain low-fat and nonfat yogurt. Fats and oils  Tub margarine without trans fats. Light or reduced-fat mayonnaise and salad dressings. Avocado. Olive, canola, sesame, or safflower oils. The items listed above may not be a complete list of foods and beverages you can eat. Contact a dietitian for more information. Foods to avoid Fruits  Canned fruit in heavy syrup. Fruit in cream or butter sauce. Fried fruit. Vegetables  Vegetables cooked in cheese, cream, or butter sauce. Fried vegetables. Grains  White bread. White pasta. White rice. Cornbread. Bagels, pastries, and croissants. Crackers and snack foods that contain trans fat and hydrogenated oils. Meats and other protein foods  Fatty cuts of meat. Ribs, chicken wings, bacon, sausage, bologna, salami, chitterlings, fatback, hot dogs, bratwurst, and packaged lunch meats. Liver and organ meats. Whole eggs and egg yolks. Chicken and [place] with skin. Fried meat. Dairy  Whole or 2% milk, cream, half-and-half, and cream cheese. Whole milk cheeses. Whole-fat or sweetened yogurt. Full-fat cheeses. Nondairy creamers and whipped toppings. Processed cheese, cheese spreads, and cheese curds. Beverages  Alcohol. Sugar-sweetened drinks such as sodas, lemonade, and fruit drinks. Fats and oils  Butter, stick margarine, lard, shortening, ghee, or bacon fat. Coconut, palm  kernel, and palm oils. Sweets and desserts  Corn syrup, sugars, honey, and molasses. Candy. Jam and jelly. Syrup. Sweetened cereals. Cookies, pies, cakes, donuts, muffins, and ice cream. The items listed above may not be a complete list of foods and beverages you should avoid. Contact a dietitian for more information. Summary  Choosing the right foods helps keep your fat and cholesterol at normal levels. This can keep you from getting certain diseases.  At meals, fill one-half of your plate with vegetables and green salads.  Eat high-fiber foods, like whole grains, beans, apples, carrots, peas, and barley.  Limit added sugar, saturated fats, alcohol, and fried foods. This information is not intended to replace advice given to you by your health care provider. Make sure you discuss any questions you have with your health care provider.

## 2020-03-03 ENCOUNTER — Other Ambulatory Visit: Payer: Self-pay

## 2020-03-03 ENCOUNTER — Ambulatory Visit (HOSPITAL_COMMUNITY): Payer: No Typology Code available for payment source | Attending: Cardiovascular Disease

## 2020-03-03 DIAGNOSIS — Q231 Congenital insufficiency of aortic valve: Secondary | ICD-10-CM | POA: Insufficient documentation

## 2020-03-03 LAB — ECHOCARDIOGRAM COMPLETE
Area-P 1/2: 4.17 cm2
S' Lateral: 2.6 cm

## 2020-05-29 ENCOUNTER — Other Ambulatory Visit: Payer: Self-pay | Admitting: Internal Medicine

## 2020-05-29 DIAGNOSIS — R918 Other nonspecific abnormal finding of lung field: Secondary | ICD-10-CM

## 2020-06-19 ENCOUNTER — Ambulatory Visit
Admission: RE | Admit: 2020-06-19 | Discharge: 2020-06-19 | Disposition: A | Discharge status: Home / Self Care | Payer: No Typology Code available for payment source | Source: Ambulatory Visit | Attending: Internal Medicine | Admitting: Internal Medicine

## 2020-06-19 DIAGNOSIS — R918 Other nonspecific abnormal finding of lung field: Secondary | ICD-10-CM

## 2020-09-14 IMAGING — CT CT HEART SCORING
2 series · 16 of 20 positions shown, 18 images · non-contrast
Comparison: None.

Addendum:
CLINICAL DATA: Risk stratification

EXAM:
Coronary Calcium Score
TECHNIQUE: The patient was scanned on a Siemens Force scanner. Axial
non-contrast 3 mm slices were carried out through the heart. The
data set was analyzed on a dedicated work station and scored using
the Agatson method.

[Series 2: casc 3.0 i36f 2 bestdiast 71 % · axial · 0.36mm/px · z∈[-233,-128]mm · 8 of 47 slices shown, 10 images]
[im 6/47  vessel]
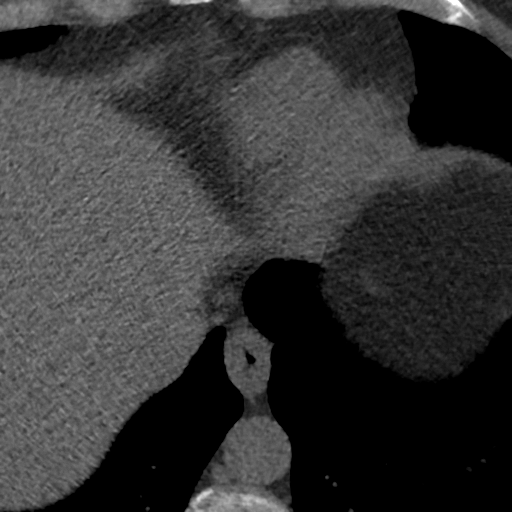
[im 6/47  lung]
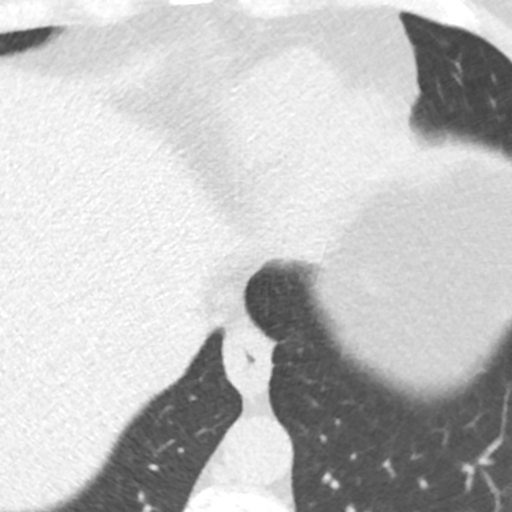
[im 11/47  vessel]
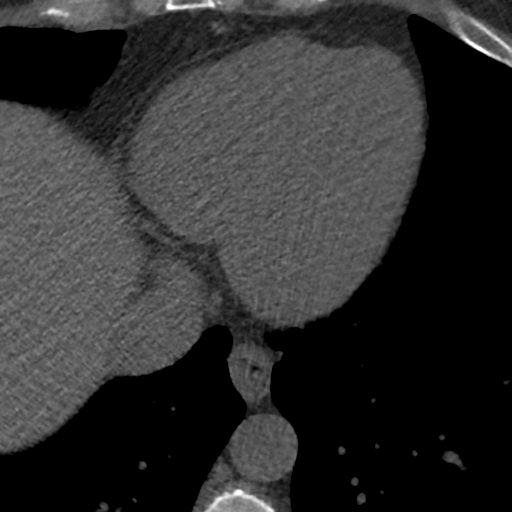
[im 16/47  vessel]
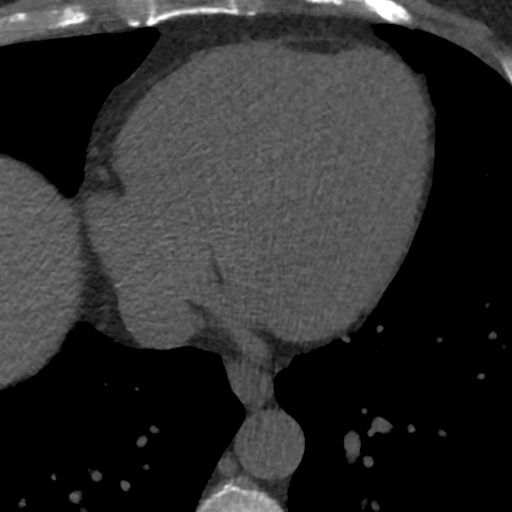
[im 21/47  vessel]
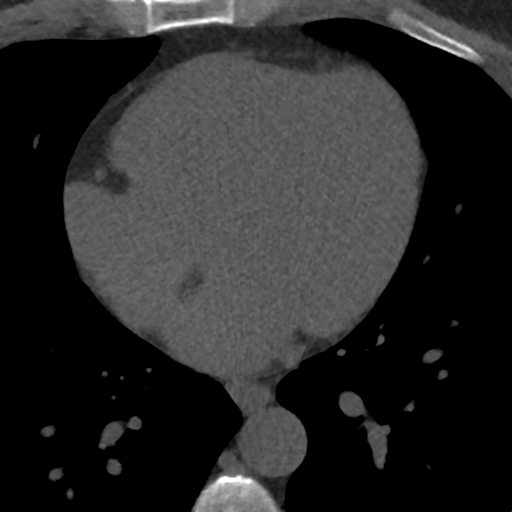
[im 26/47  vessel]
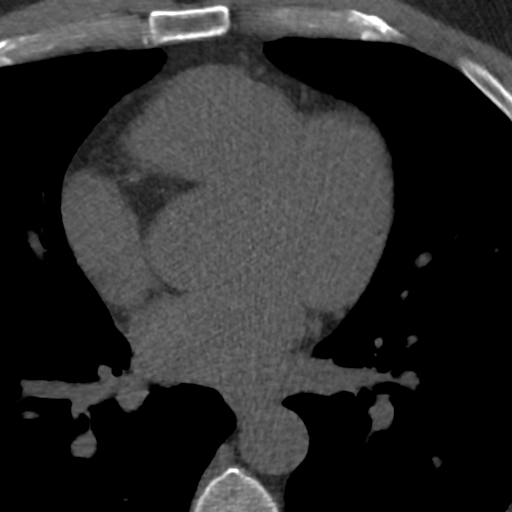
[im 26/47  lung]
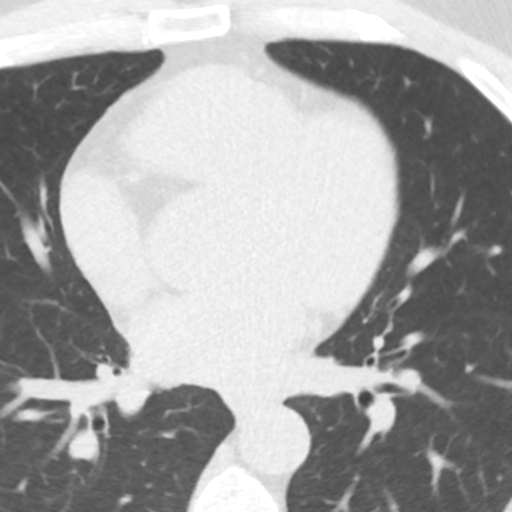
[im 31/47  vessel]
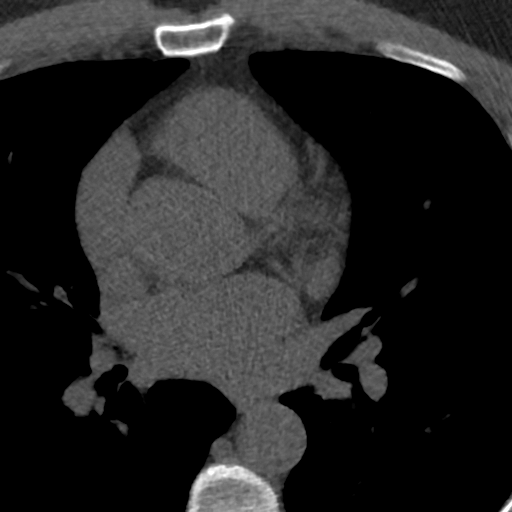
[im 36/47  vessel]
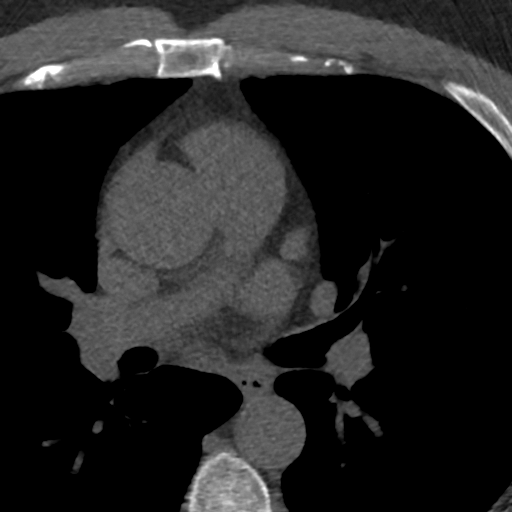
[im 41/47  vessel]
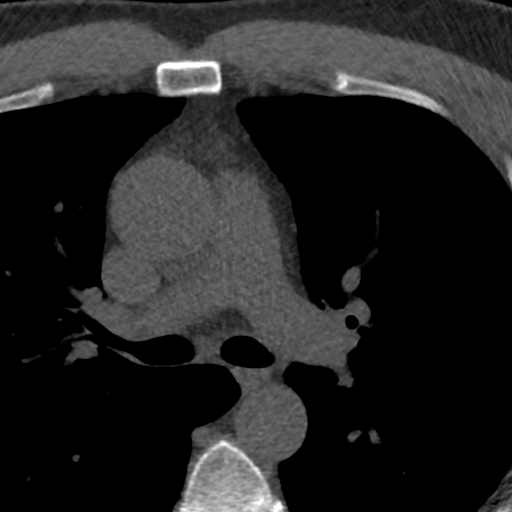

[Series 4: lung st 70 % · axial · 0.75mm/px · z∈[-232,-128]mm · 8 of 47 slices shown]
[im 6/47  lung]
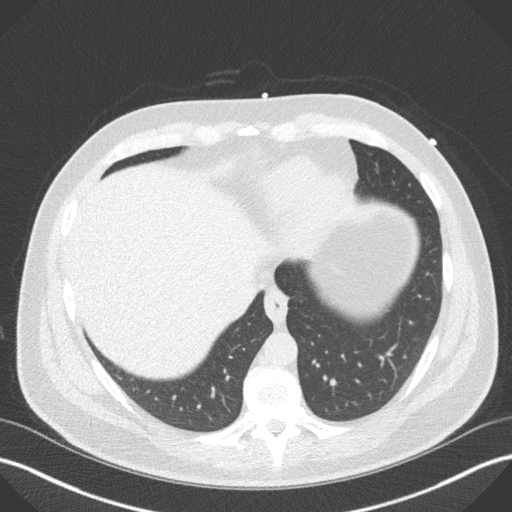
[im 11/47  lung]
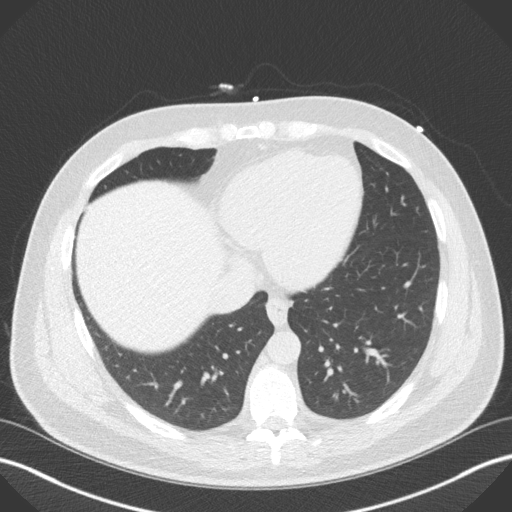
[im 16/47  lung]
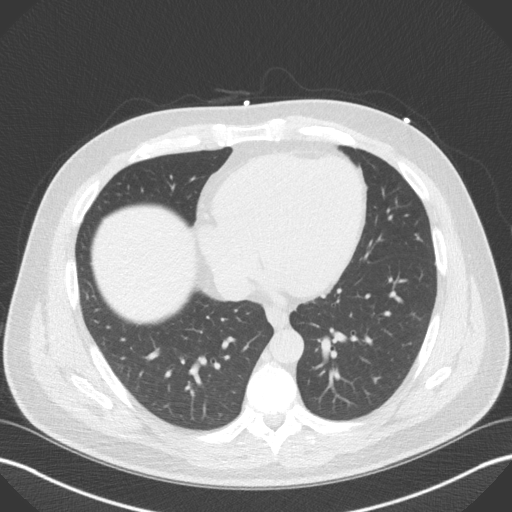
[im 21/47  lung]
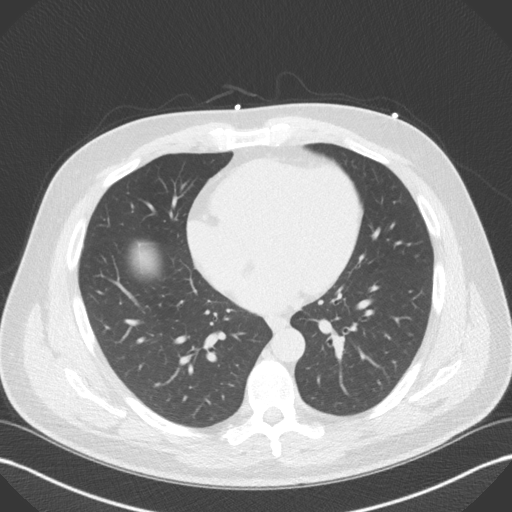
[im 26/47  lung]
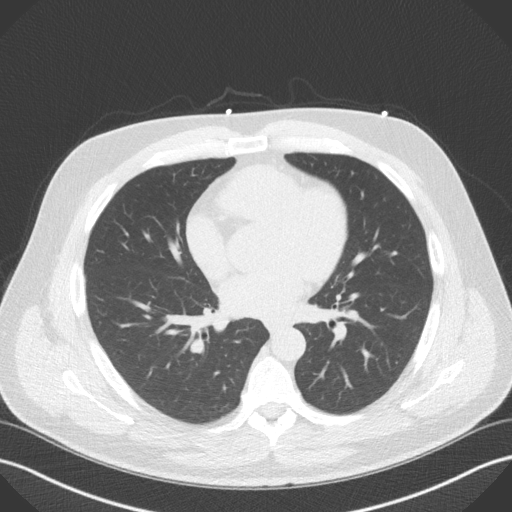
[im 31/47  lung]
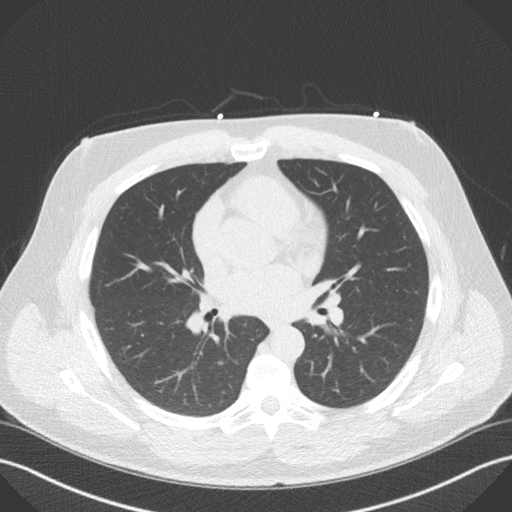
[im 36/47  lung]
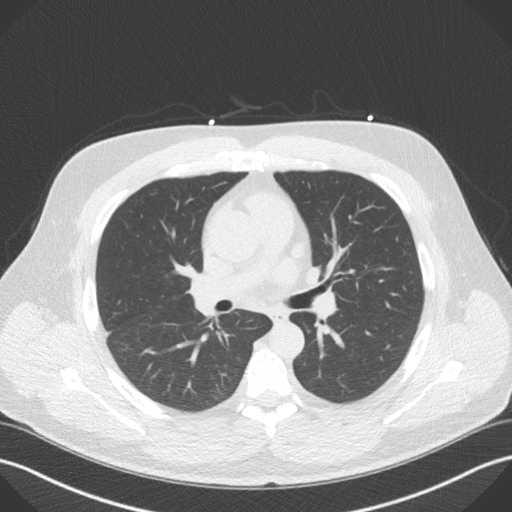
[im 41/47  lung]
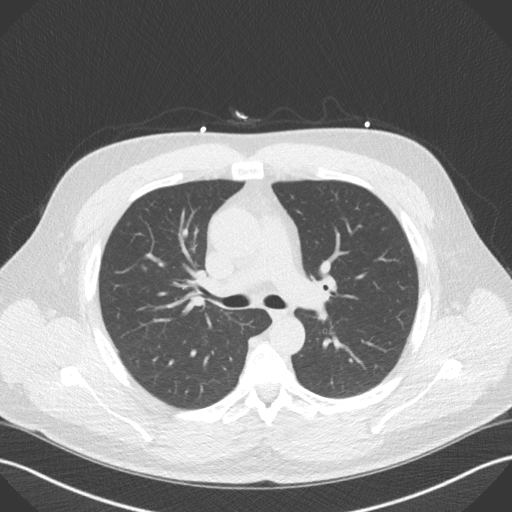

[16 of 20 positions shown; findings below may reference images not displayed]

FINDINGS: Non-cardiac: See separate report from [REDACTED].

Ascending Aorta: Normal Caliber.  No calcifications.

Pericardium: Normal

Coronary arteries: Normal coronary origins.
IMPRESSION: Coronary calcium score of 0. This was 0 percentile for age and sex
matched control.

Kel Juarbe

EXAM:
OVER-READ INTERPRETATION  CT CHEST

The following report is an over-read performed by radiologist Dr.
does not include interpretation of cardiac or coronary anatomy or
pathology. The coronary calcium score/coronary CTA interpretation by
the cardiologist is attached.
FINDINGS: In the visualized portions of the lung fields, there is a
ill-defined area of nodular ground-glass opacity in the anterior
right upper lobe which is incompletely visualized on this exam.
Differential diagnosis includes infectious and inflammatory
etiologies and low-grade adenocarcinoma. No pleural fluid seen.

The visualized portions of the mediastinum and chest wall are
unremarkable.
IMPRESSION: Incomplete visualization of nodular ground-glass opacity in the
anterior right upper lobe. Differential diagnosis includes
infectious and inflammatory etiologies as well as low-grade
adenocarcinoma. Recommend follow-up by chest CT without contrast in
3 months.

These results will be called to the ordering clinician or
representative by the Radiologist Assistant, and communication
documented in the PACS or zVision Dashboard.

*** End of Addendum ***
FINDINGS: Non-cardiac: See separate report from [REDACTED].

Ascending Aorta: Normal Caliber.  No calcifications.

Pericardium: Normal

Coronary arteries: Normal coronary origins.
IMPRESSION: Coronary calcium score of 0. This was 0 percentile for age and sex
matched control.

Kel Juarbe

## 2021-01-26 ENCOUNTER — Encounter (HOSPITAL_BASED_OUTPATIENT_CLINIC_OR_DEPARTMENT_OTHER): Payer: Self-pay

## 2021-06-04 ENCOUNTER — Other Ambulatory Visit: Payer: Self-pay | Admitting: Internal Medicine

## 2021-06-04 DIAGNOSIS — R918 Other nonspecific abnormal finding of lung field: Secondary | ICD-10-CM

## 2021-06-28 ENCOUNTER — Other Ambulatory Visit: Payer: Self-pay

## 2021-06-28 ENCOUNTER — Ambulatory Visit
Admission: RE | Admit: 2021-06-28 | Discharge: 2021-06-28 | Disposition: A | Discharge status: Home / Self Care | Payer: No Typology Code available for payment source | Source: Ambulatory Visit | Attending: Internal Medicine | Admitting: Internal Medicine

## 2021-06-28 DIAGNOSIS — R918 Other nonspecific abnormal finding of lung field: Secondary | ICD-10-CM

## 2021-12-24 ENCOUNTER — Other Ambulatory Visit: Payer: Self-pay | Admitting: Internal Medicine

## 2021-12-24 DIAGNOSIS — R918 Other nonspecific abnormal finding of lung field: Secondary | ICD-10-CM

## 2022-11-14 IMAGING — CT CT CHEST W/O CM
1 of 2 series · 14 of 30 positions shown, 18 images · non-contrast
Comparison: 06/19/2020

CLINICAL DATA: Follow-up abnormal CT chest finding, former smoker

EXAM:
CT CHEST WITHOUT CONTRAST
TECHNIQUE: Multidetector CT imaging of the chest was performed following the
standard protocol without IV contrast.

[Series 2: chest w/(date) · axial · 0.75mm/px · z∈[+786,+1074]mm · 14 of 169 slices shown, 18 images]
[im 13/169  mediastinal]
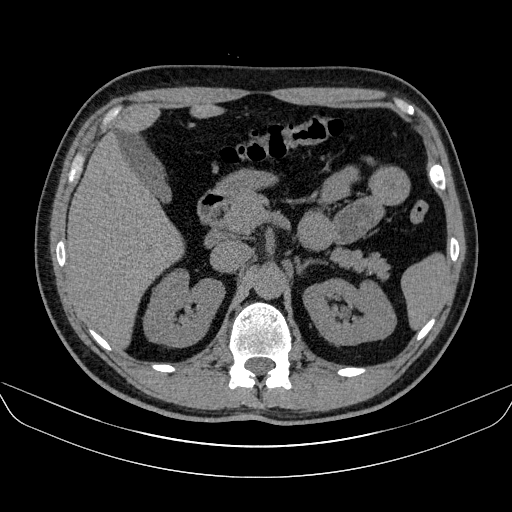
[im 13/169  lung]
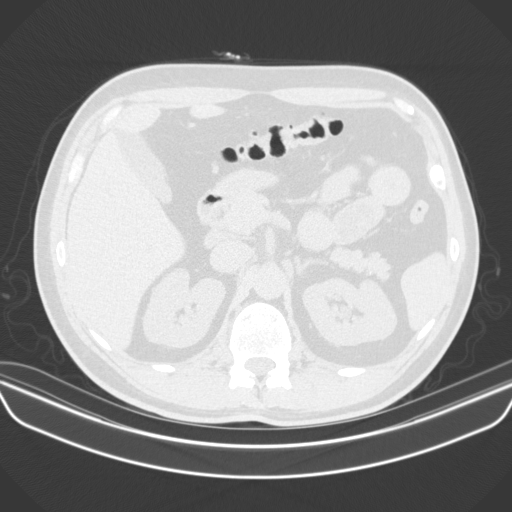
[im 25/169  lung]
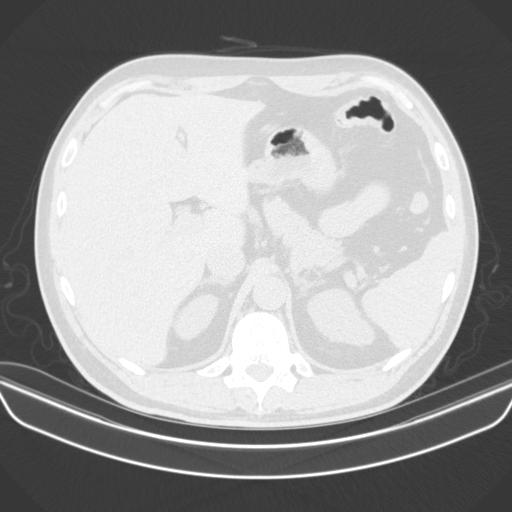
[im 37/169  lung]
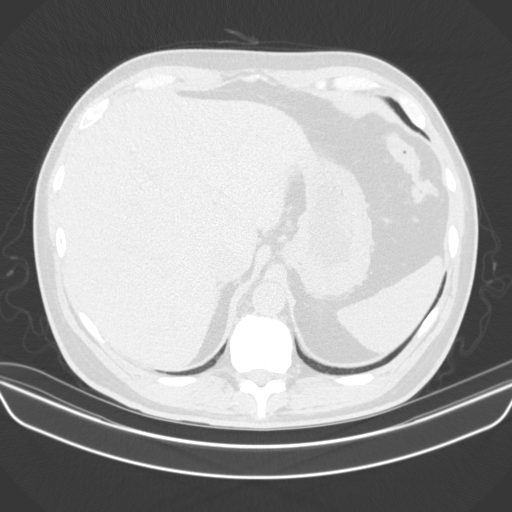
[im 49/169  lung]
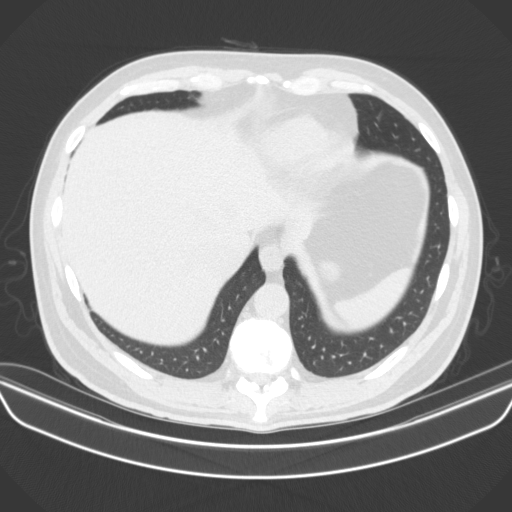
[im 61/169  mediastinal]
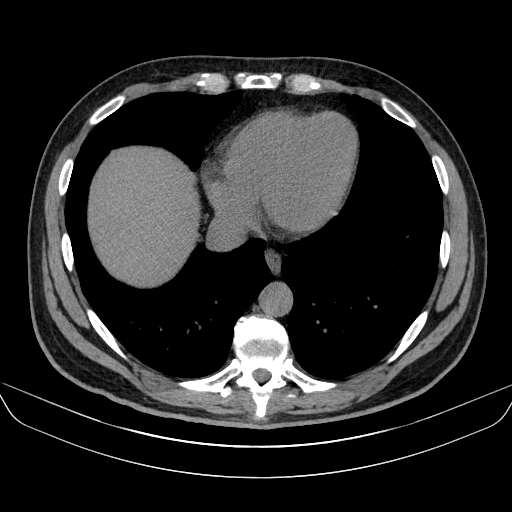
[im 61/169  lung]
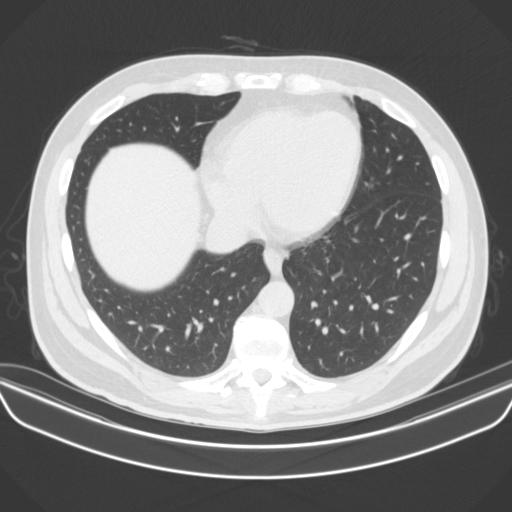
[im 73/169  lung]
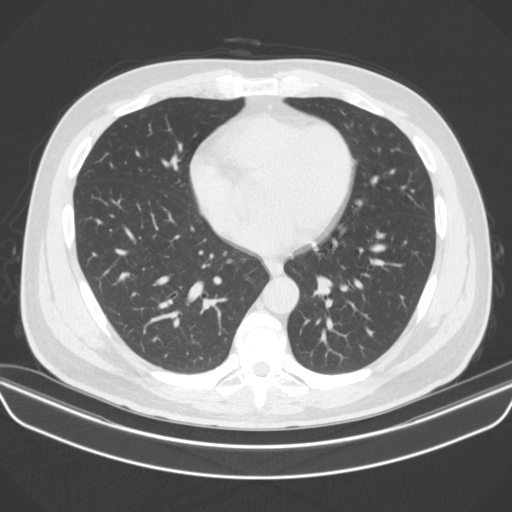
[im 80/169  lung]
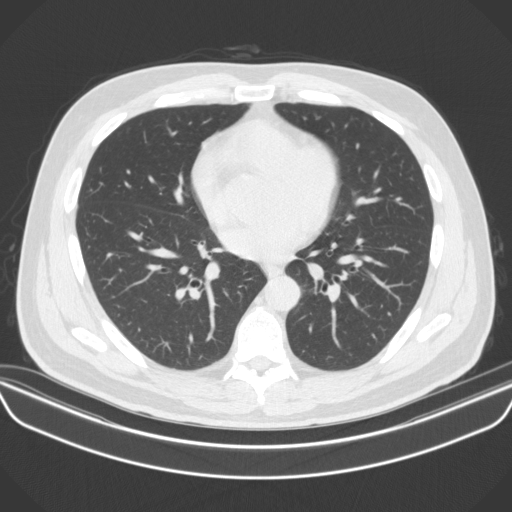
[im 85/169  lung]
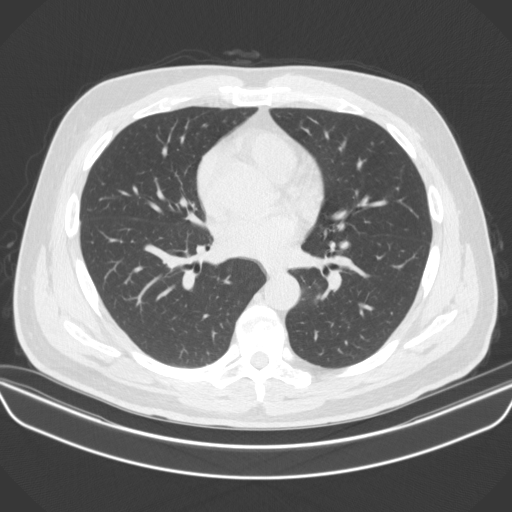
[im 97/169  mediastinal]
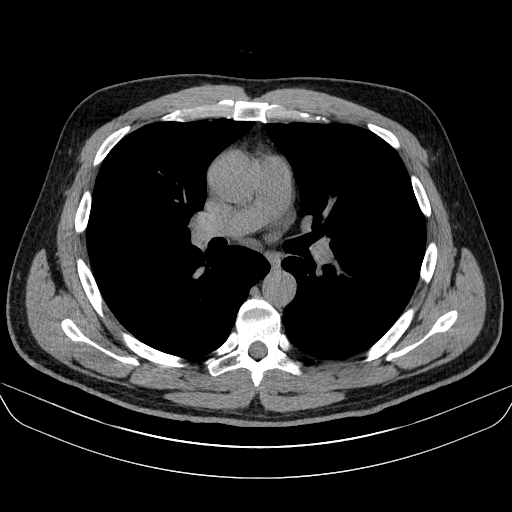
[im 97/169  lung]
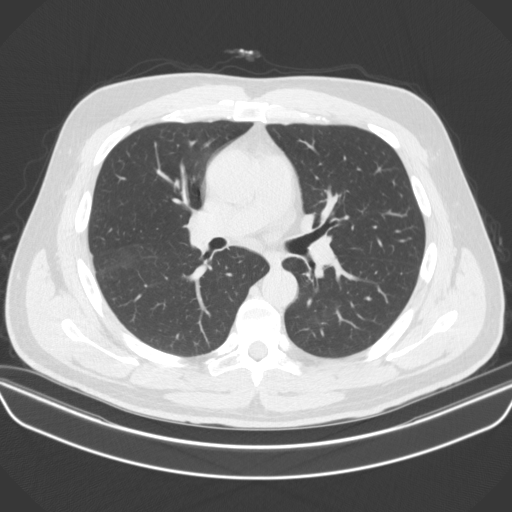
[im 109/169  lung]
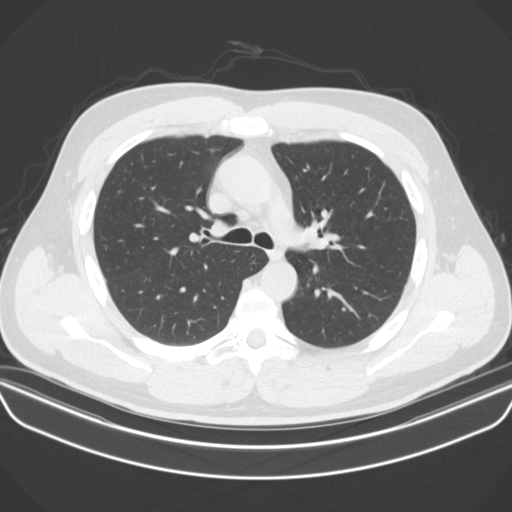
[im 121/169  lung]
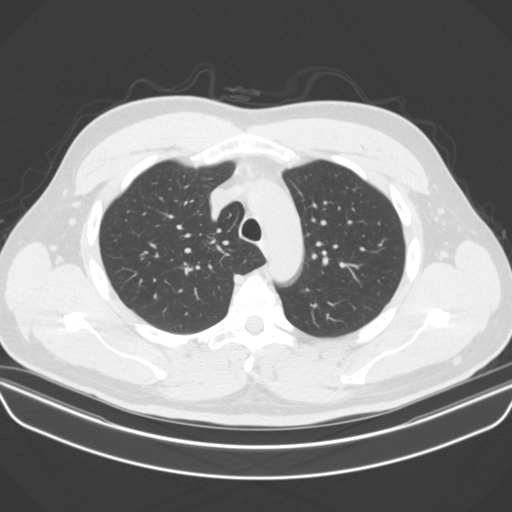
[im 133/169  lung]
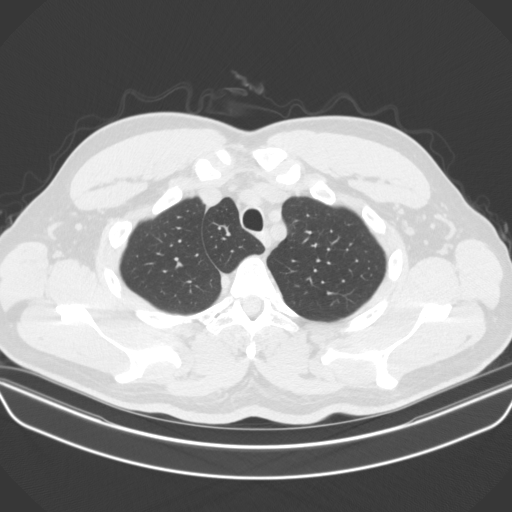
[im 145/169  mediastinal]
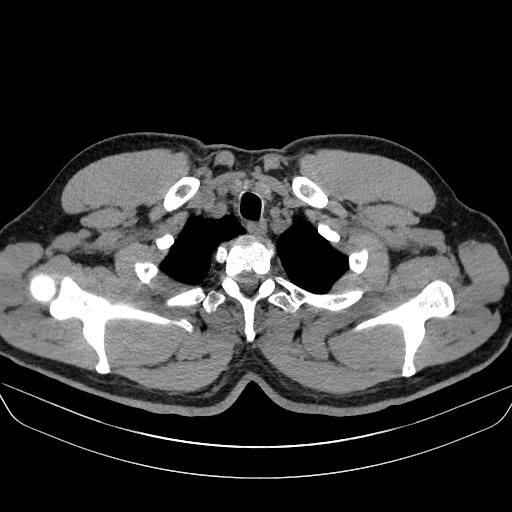
[im 145/169  lung]
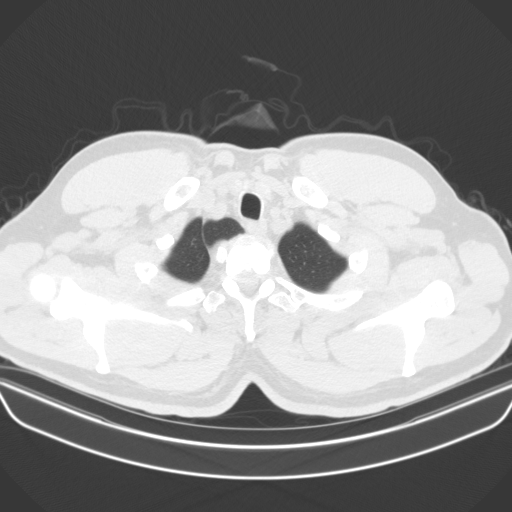
[im 157/169  lung]
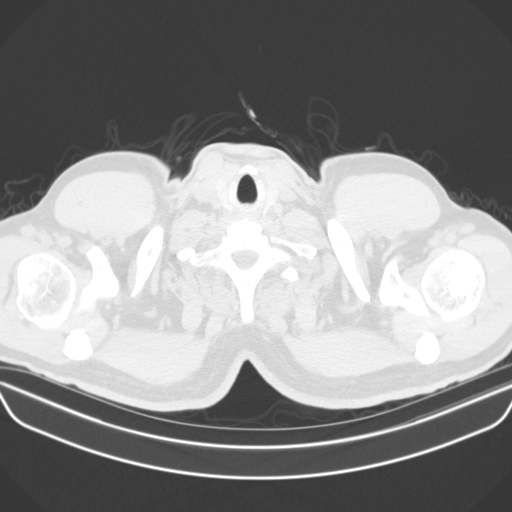

[14 of 30 positions shown; findings below may reference images not displayed]

FINDINGS: Cardiovascular: Ascending thoracic aorta upper normal caliber 3.9 cm
diameter. Heart unremarkable. No pericardial effusion.

Mediastinum/Nodes: Base of cervical region normal appearance.
Esophagus unremarkable. No thoracic adenopathy.

Lungs/Pleura: Azygous fissure noted. Lungs clear. Area of subtle
residual opacity in the RIGHT upper lobe on the previous exam
resolved. Two vague areas of sub solid opacity are seen in the LEFT
lower lobe, 6 mm image 80, 7 mm image 80, not seen previously.
Additional questionable ground-glass opacity 4 mm diameter
identified LEFT lower lobe image 71.

Upper Abdomen: Visualized upper abdomen unremarkable

Musculoskeletal: No acute osseous findings.
IMPRESSION: Resolution of previously identified RIGHT upper lobe infiltrate.

Vague areas of sub solid opacity in the LEFT lower lobe, largest 6
mm and 7 mm in sizes, not seen previously, nonspecific; recommend a
non-contrast Chest CT at 3-6 months. Subsequent management based on
the most suspicious nodule(s).

These guidelines do not apply to immunocompromised patients and
patients with cancer. Follow up in patients with significant
comorbidities as clinically warranted. For lung cancer screening,
adhere to Lung-RADS guidelines. Reference: Radiology. 2567;

## 2023-02-11 ENCOUNTER — Encounter: Payer: Self-pay | Admitting: Registered Nurse

## 2023-02-11 ENCOUNTER — Other Ambulatory Visit: Payer: Self-pay | Admitting: Registered Nurse

## 2023-02-11 ENCOUNTER — Ambulatory Visit
Admission: RE | Admit: 2023-02-11 | Discharge: 2023-02-11 | Disposition: A | Discharge status: Home / Self Care | Payer: No Typology Code available for payment source | Source: Ambulatory Visit | Attending: Registered Nurse | Admitting: Registered Nurse

## 2023-02-11 ENCOUNTER — Encounter: Payer: Self-pay | Admitting: Internal Medicine

## 2023-02-11 DIAGNOSIS — K625 Hemorrhage of anus and rectum: Secondary | ICD-10-CM

## 2023-02-11 MED ORDER — IOPAMIDOL (ISOVUE-300) INJECTION 61%
200.0000 mL | Freq: Once | INTRAVENOUS | Status: AC | PRN
  Administered 2023-02-11: 100 mL via INTRAVENOUS

## 2024-05-02 NOTE — Progress Notes (Unsigned)
 Cardiology Office Note:    Date:  05/02/2024   ID:  Douglas Marquez, DOB [birth year], MRN [id number]  PCP:  Tisovec, Richard W, MD  Cardiologist:  None  Electrophysiologist:  None   Referring MD: Vernadine Charlie ORN, MD   No chief complaint on file. ***  History of Present Illness:    Douglas Marquez is a 43 y.o. male with a hx of bicuspid aortic valve who is referred by Dr. Tisovec for evaluation of aortic insufficiency.  Previously followed with Dr. Raford, last seen in 2021.  Echocardiogram 03/03/2020 showed EF 55 to 60%, normal RV function, bicuspid aortic valve with mild aortic regurgitation.  Calcium score 04/29/2019 was 0.  Past Medical History:  Diagnosis Date   Acne    Bicuspid aortic valve 02/28/2017   Essential hypertension 03/26/2019   Heart murmur    History of concussion    12/ 2016  no residual    Palpitations 11/14/2016   PONV (postoperative nausea and vomiting)    Syncope 11/14/2016   Testicular torsion    intermittent bilaterally    Past Surgical History:  Procedure Laterality Date   ANTERIOR CRUCIATE LIGAMENT REPAIR Left 06/1999   ORCHIOPEXY Bilateral 1990's    Current Medications: No outpatient medications have been marked as taking for the 05/03/24 encounter (Appointment) with Kate Lonni CROME, MD.     Allergies:   Patient has no known allergies.   Social History   Socioeconomic History   Marital status: Married    Spouse name: Not on file   Number of children: Not on file   Years of education: Not on file   Highest education level: Not on file  Occupational History   Not on file  Tobacco Use   Smoking status: Former    Current packs/day: 0.00    Types: Cigarettes    Start date: 07/11/2006    Quit date: 07/11/2008    Years since quitting: 15.8   Smokeless tobacco: Never  Substance and Sexual Activity   Alcohol use: Yes    Comment: RARE   Drug use: No   Sexual activity: Not on file  Other Topics Concern   Not on file  Social History  Narrative   Not on file   Social Drivers of Health   Financial Resource Strain: Not on file  Food Insecurity: Not on file  Transportation Needs: Not on file  Physical Activity: Not on file  Stress: Not on file  Social Connections: Not on file     Family History: The patient's ***family history includes Cancer in his maternal grandmother and paternal grandfather; Heart attack in his maternal grandfather and maternal grandmother; Heart disease in his paternal grandfather; Parkinson's disease in his maternal grandfather.  ROS:   Please see the history of present illness.    *** All other systems reviewed and are negative.  EKGs/Labs/Other Studies Reviewed:    The following studies were reviewed today: ***  EKG:  EKG is *** ordered today.  The ekg ordered today demonstrates ***  Recent Labs: No results found for requested labs within last 365 days.  Recent Lipid Panel    Component Value Date/Time   CHOL 212 (H) 03/26/2019 0925   TRIG 194 (H) 03/26/2019 0925   HDL 36 (L) 03/26/2019 0925   CHOLHDL 5.9 (H) 03/26/2019 0925   LDLCALC 141 (H) 03/26/2019 9074    Physical Exam:    VS:  There were no vitals taken for this visit.    Wt Readings from Last  3 Encounters:  08/17/19 215 lb (97.5 kg)  03/26/19 218 lb 12.8 oz (99.2 kg)  03/05/17 198 lb (89.8 kg)     GEN: *** Well nourished, well developed in no acute distress HEENT: Normal NECK: No JVD; No carotid bruits LYMPHATICS: No lymphadenopathy CARDIAC: ***RRR, no murmurs, rubs, gallops RESPIRATORY:  Clear to auscultation without rales, wheezing or rhonchi  ABDOMEN: Soft, non-tender, non-distended MUSCULOSKELETAL:  No edema; No deformity  SKIN: Warm and dry NEUROLOGIC:  Alert and oriented x 3 PSYCHIATRIC:  Normal affect   ASSESSMENT:    No diagnosis found. PLAN:    Bicuspid aortic valve: Mild AI on echocardiogram 04/2019.  Will update echocardiogram  Hyperlipidemia: LDL 141 03/26/2019.  Calcium score 0  04/2019  RTC in***   Medication Adjustments/Labs and Tests Ordered: Current medicines are reviewed at length with the patient today.  Concerns regarding medicines are outlined above.  No orders of the defined types were placed in this encounter.  No orders of the defined types were placed in this encounter.   There are no Patient Instructions on file for this visit.   Signed, Lonni LITTIE Nanas, MD  05/02/2024 2:49 PM    [facility]

## 2024-05-03 ENCOUNTER — Encounter: Payer: Self-pay | Admitting: Cardiology

## 2024-05-03 ENCOUNTER — Ambulatory Visit: Attending: Cardiology | Admitting: Cardiology

## 2024-05-03 VITALS — BP 122/74 | HR 62 | Ht 73.0 in | Wt 203.0 lb

## 2024-05-03 DIAGNOSIS — R918 Other nonspecific abnormal finding of lung field: Secondary | ICD-10-CM

## 2024-05-03 DIAGNOSIS — Q2381 Bicuspid aortic valve: Secondary | ICD-10-CM

## 2024-05-03 DIAGNOSIS — E785 Hyperlipidemia, unspecified: Secondary | ICD-10-CM

## 2024-05-03 NOTE — Patient Instructions (Addendum)
 Medication Instructions:  NO CHANGES   Lab Work: BMET AND FASTING LIPID PANEL TO BE DONE IN 2 MONTHS BEFORE TESTING. PLEASE BE FASTING.  Testing/Procedures: TO BE DONE IN 2 MONTHS (AROUND 07-05-2024)  Non-Cardiac CT Angiography (CTA), is a special type of CT scan that uses a computer to produce multi-dimensional views of major blood vessels throughout the body. In CT angiography, a contrast material is injected through an IV to help visualize the blood vessels   Your physician has requested that you have an echocardiogram. Echocardiography is a painless test that uses sound waves to create images of your heart. It provides your doctor with information about the size and shape of your heart and how well your heart's chambers and valves are working. This procedure takes approximately one hour. There are no restrictions for this procedure. Please do NOT wear cologne, perfume, aftershave, or lotions (deodorant is allowed). Please arrive 15 minutes prior to your appointment time.  Please note: We ask at that you not bring children with you during ultrasound (echo/ vascular) testing. Due to room size and safety concerns, children are not allowed in the ultrasound rooms during exams. Our front office staff cannot provide observation of children in our lobby area while testing is being conducted. An adult accompanying a patient to their appointment will only be allowed in the ultrasound room at the discretion of the ultrasound technician under special circumstances. We apologize for any inconvenience.   Follow-Up: At [facility], you and your health needs are our priority.  As part of our continuing mission to provide you with exceptional heart care, our providers are all part of one team.  This team includes your primary Cardiologist (physician) and Advanced Practice Providers or APPs (Physician Assistants and Nurse Practitioners) who all work together to provide you with the care you need,  when you need it.  Your next appointment:   1 YEAR  Provider:   DR. LONNI NANAS, MD

## 2024-06-29 ENCOUNTER — Ambulatory Visit (HOSPITAL_COMMUNITY)
Admission: RE | Admit: 2024-06-29 | Discharge: 2024-06-29 | Disposition: A | Discharge status: Home / Self Care | Source: Ambulatory Visit | Attending: Cardiology | Admitting: Cardiology

## 2024-06-29 ENCOUNTER — Ambulatory Visit: Payer: Self-pay | Admitting: Cardiology

## 2024-06-29 DIAGNOSIS — R918 Other nonspecific abnormal finding of lung field: Secondary | ICD-10-CM | POA: Diagnosis present

## 2024-06-29 DIAGNOSIS — Q2381 Bicuspid aortic valve: Secondary | ICD-10-CM | POA: Insufficient documentation

## 2024-06-29 LAB — ECHOCARDIOGRAM COMPLETE
AR max vel: 3.19 cm2
AV Area VTI: 2.87 cm2
AV Area mean vel: 2.81 cm2
AV Mean grad: 6 mmHg
AV Peak grad: 11.3 mmHg
Ao pk vel: 1.68 m/s
Area-P 1/2: 5.02 cm2
S' Lateral: 2.81 cm

## 2024-07-09 LAB — BASIC METABOLIC PANEL WITH GFR
BUN/Creatinine Ratio: 16 (ref 9–20)
BUN: 15 mg/dL (ref 6–24)
CO2: 21 mmol/L (ref 20–29)
Calcium: 9.8 mg/dL (ref 8.7–10.2)
Chloride: 100 mmol/L (ref 96–106)
Creatinine, Ser: 0.95 mg/dL (ref 0.76–1.27)
Glucose: 109 mg/dL — ABNORMAL HIGH (ref 70–99)
Potassium: 4 mmol/L (ref 3.5–5.2)
Sodium: 137 mmol/L (ref 134–144)
eGFR: 101 mL/min/1.73

## 2024-07-12 ENCOUNTER — Ambulatory Visit (HOSPITAL_COMMUNITY)
Admission: RE | Admit: 2024-07-12 | Discharge: 2024-07-12 | Disposition: A | Discharge status: Home / Self Care | Source: Ambulatory Visit | Attending: Cardiology | Admitting: Cardiology

## 2024-07-12 DIAGNOSIS — R918 Other nonspecific abnormal finding of lung field: Secondary | ICD-10-CM | POA: Diagnosis present

## 2024-07-12 DIAGNOSIS — Q2381 Bicuspid aortic valve: Secondary | ICD-10-CM | POA: Insufficient documentation

## 2024-07-12 MED ORDER — IOHEXOL 350 MG/ML SOLN
75.0000 mL | Freq: Once | INTRAVENOUS | Status: AC | PRN
  Administered 2024-07-12: 75 mL via INTRAVENOUS

## 2024-07-14 NOTE — Telephone Encounter (Signed)
 Wife Kai) returned RN's call regarding results.
